# Patient Record
Sex: Female | Born: 1960 | Hispanic: Yes | Marital: Married | Smoking: Former smoker
Health system: Southern US, Community
[De-identification: ages and names within clinical notes are randomized; demographics above are authoritative.]

## PROBLEM LIST (undated history)

## (undated) DIAGNOSIS — E119 Type 2 diabetes mellitus without complications: Secondary | ICD-10-CM

## (undated) DIAGNOSIS — Z789 Other specified health status: Secondary | ICD-10-CM

## (undated) DIAGNOSIS — R918 Other nonspecific abnormal finding of lung field: Secondary | ICD-10-CM

## (undated) HISTORY — PX: TUBAL LIGATION: SHX77

## (undated) HISTORY — PX: CHOLECYSTECTOMY: SHX55

## (undated) HISTORY — DX: Type 2 diabetes mellitus without complications: E11.9

---

## 2011-09-22 ENCOUNTER — Ambulatory Visit: Payer: Self-pay | Admitting: Family Medicine

## 2012-08-18 ENCOUNTER — Encounter (HOSPITAL_COMMUNITY): Payer: Self-pay

## 2012-08-18 ENCOUNTER — Emergency Department (HOSPITAL_COMMUNITY): Payer: Medicaid Other

## 2012-08-18 ENCOUNTER — Emergency Department (HOSPITAL_COMMUNITY)
Admission: EM | Admit: 2012-08-18 | Discharge: 2012-08-19 | Disposition: A | Discharge status: Home / Self Care | Payer: Medicaid Other | Attending: Emergency Medicine | Admitting: Emergency Medicine

## 2012-08-18 DIAGNOSIS — R0602 Shortness of breath: Secondary | ICD-10-CM | POA: Insufficient documentation

## 2012-08-18 DIAGNOSIS — F411 Generalized anxiety disorder: Secondary | ICD-10-CM | POA: Insufficient documentation

## 2012-08-18 DIAGNOSIS — L539 Erythematous condition, unspecified: Secondary | ICD-10-CM | POA: Insufficient documentation

## 2012-08-18 DIAGNOSIS — R079 Chest pain, unspecified: Secondary | ICD-10-CM

## 2012-08-18 DIAGNOSIS — R0789 Other chest pain: Secondary | ICD-10-CM | POA: Insufficient documentation

## 2012-08-18 DIAGNOSIS — Z79899 Other long term (current) drug therapy: Secondary | ICD-10-CM | POA: Insufficient documentation

## 2012-08-18 DIAGNOSIS — Z87891 Personal history of nicotine dependence: Secondary | ICD-10-CM | POA: Insufficient documentation

## 2012-08-18 LAB — CBC WITH DIFFERENTIAL/PLATELET
Basophils Absolute: 0.1 10*3/uL (ref 0.0–0.1)
Basophils Relative: 1 % (ref 0–1)
Eosinophils Relative: 2 % (ref 0–5)
HCT: 39.3 % (ref 36.0–46.0)
Hemoglobin: 13.1 g/dL (ref 12.0–15.0)
MCH: 29.3 pg (ref 26.0–34.0)
MCHC: 33.3 g/dL (ref 30.0–36.0)
MCV: 87.9 fL (ref 78.0–100.0)
Monocytes Absolute: 0.6 10*3/uL (ref 0.1–1.0)
Monocytes Relative: 6 % (ref 3–12)
Neutro Abs: 5 10*3/uL (ref 1.7–7.7)
RDW: 13.9 % (ref 11.5–15.5)

## 2012-08-18 LAB — BASIC METABOLIC PANEL
BUN: 14 mg/dL (ref 6–23)
Calcium: 9.4 mg/dL (ref 8.4–10.5)
Chloride: 103 mEq/L (ref 96–112)
Creatinine, Ser: 0.56 mg/dL (ref 0.50–1.10)
GFR calc Af Amer: >90 mL/min (ref >90)

## 2012-08-18 LAB — LIPASE, BLOOD: Lipase: 51 U/L (ref 11–59)

## 2012-08-18 MED ORDER — DIAZEPAM 5 MG PO TABS
5.0000 mg | ORAL_TABLET | Freq: Once | ORAL | Status: AC
  Administered 2012-08-18: 5 mg via ORAL
  Filled 2012-08-18: qty 1

## 2012-08-18 MED ORDER — DIAZEPAM 5 MG PO TABS: 5.0000 mg | ORAL_TABLET | Freq: Two times a day (BID) | ORAL | Status: DC

## 2012-08-18 NOTE — ED Notes (Signed)
No distress.

## 2012-08-18 NOTE — ED Notes (Addendum)
Pt c/o intermittent Left "lung" burning radiating through to her back, SOB, and nausea x3 months. pt reports increase chest pain x1 weekPt also reports a burning lesion to Left side of back x4 days

## 2012-08-18 NOTE — ED Notes (Signed)
Pt to xray

## 2012-08-18 NOTE — ED Provider Notes (Signed)
History    CSN: [id number] Arrival date & time 08/18/12  1905  First MD Initiated Contact with Patient 08/18/12 2044     Chief Complaint  Patient presents with  . Chest Pain   (Consider location/radiation/quality/duration/timing/severity/associated sxs/prior Treatment) HPI Comments: 52 y.o. Female with no significant PMHx presents today with multiple complaints that have been bothering her off and on for several weeks, months, and years. Pt states she does not have a primary care provider, does not like doctors, and has not had any medical care in many years. Pt had to be re-directed frequently as many complaints over decades would come to surface. Pt seemed most concerned about the chest pain which has been intermittent over the last year and bothered her most over the last two nights. She describes the pain as burning and sharp, mild to severe, bothers her mostly at night in bed, doesn't notice it during the day, radiates to her back, and causes her to be short of breath at times. Pt states nothing makes it better or worse and she has taken no interventions. She just waits for it to go away. Denies fevers, nausea, vomiting, cough.   Patient is a 52 y.o. female presenting with chest pain.  Chest Pain Associated symptoms: shortness of breath   Associated symptoms: no diaphoresis, no dizziness, no fever, no headache, no nausea, no numbness, no palpitations, not vomiting and no weakness    History reviewed. No pertinent past medical history. Past Surgical History  Procedure Laterality Date  . Cholecystectomy    . Tubal ligation     History reviewed. No pertinent family history. History  Substance Use Topics  . Smoking status: Former Games developer  . Smokeless tobacco: Not on file  . Alcohol Use: No   OB History   Grav Para Term Preterm Abortions TAB SAB Ect Mult Living                 Review of Systems  Constitutional: Negative for fever and diaphoresis.  HENT: Negative for neck pain  and neck stiffness.   Eyes: Negative for visual disturbance.  Respiratory: Positive for shortness of breath. Negative for apnea and chest tightness.   Cardiovascular: Positive for chest pain. Negative for palpitations.  Gastrointestinal: Negative for nausea, vomiting, diarrhea and constipation.  Genitourinary: Negative for dysuria.  Musculoskeletal: Negative for gait problem.  Skin:       Complaining of a skin irritation to left side of mid back  Neurological: Negative for dizziness, weakness, light-headedness, numbness and headaches.    Allergies  Review of patient's allergies indicates no known allergies.  Home Medications   Current Outpatient Rx  Name  Route  Sig  Dispense  Refill  . Multiple Vitamin (MULTIVITAMIN WITH MINERALS) TABS   Oral   Take 1 tablet by mouth daily.          BP 114/72  Pulse 74  Temp(Src) 99.2 F (37.3 C) (Oral)  Resp 16  Ht 4\' 11"  (1.499 m)  Wt 145 lb (65.772 kg)  BMI 29.27 kg/m2  SpO2 96% Physical Exam  Nursing note and vitals reviewed. Constitutional: She is oriented to person, place, and time. She appears well-developed and well-nourished. No distress.  HENT:  Head: Normocephalic and atraumatic.  Eyes: Conjunctivae and EOM are normal.  Neck: Normal range of motion. Neck supple.  No meningeal signs  Cardiovascular: Normal rate, regular rhythm, normal heart sounds and intact distal pulses.  Exam reveals no gallop and no friction rub.  No murmur heard. Pulmonary/Chest: Effort normal and breath sounds normal. No respiratory distress. She has no wheezes. She has no rales. She exhibits no tenderness.  Abdominal: Soft. Bowel sounds are normal. She exhibits no distension. There is no tenderness. There is no rebound and no guarding.  Musculoskeletal: Normal range of motion. She exhibits no edema and no tenderness.  FROM to upper and lower extremities  Neurological: She is alert and oriented to person, place, and time. No cranial nerve deficit.   Speech is clear and goal oriented, follows commands Sensation normal to light touch and two point discrimination Moves extremities without ataxia, coordination intact Normal gait and balance Normal strength in upper and lower extremities bilaterally including dorsiflexion and plantar flexion, strong and equal grip strength   Skin: Skin is warm and dry. She is not diaphoretic. There is erythema.  1 cm oval abrasion noted on the left side of the thoracic spine distal tot he latissimus dorsi  Psychiatric:  anxious    ED Course  Procedures (including critical care time) Labs Reviewed  CBC WITH DIFFERENTIAL - Abnormal; Notable for the following:    Lymphs Abs 4.1 (*)    All other components within normal limits  BASIC METABOLIC PANEL  LIPASE, BLOOD  URINALYSIS, ROUTINE W REFLEX MICROSCOPIC  POCT I-STAT TROPONIN I   Dg Chest 2 View  08/18/2012   *RADIOLOGY REPORT*  Clinical Data: Chest pain, shortness of breath and cough off and on for 3 years, former smoker  CHEST - 2 VIEW  Comparison: None  Findings: Upper-normal size of cardiac silhouette. Slight pulmonary vascular congestion. Mediastinal contours normal. Lungs clear. No pleural effusion or pneumothorax. Bones unremarkable.  IMPRESSION: No acute abnormalities.   Original Report Authenticated By: Ulyses Southward, M.D.   1. Chest pain at rest     MDM  No hx of coronary dz in an otherwise healthy pt who admits previous episodes of anxiety for which she used to be treated medically. PE is benign, neuro exam is normal, lungs CTA, equal full expansion. Pt was highly anxious during exam, often re-directed by myself or her husband at bedside when she went on tangents of other pain and maladies that have occurred since her pregnancies, expressing worries of what they might mean. Suspicion for ACS or asthma attack is low. Not concerning for pneumothorax, pnuemonia, aortic dissection, PE (Pt denies a history of travel, immobilization, surgery, fevers,  cancer, oral contraceptives or hormone use, swelling of the legs. The patient has no history of venous thromboembolis).   "Rash" looked like an aggravation of the skin at the bra line. No evidence of SJS or necrotizing fasciitis. Not pruritic, not painful as pt was in hospital gown, some discomfort on palpation. No blisters, pustules, or scaling. No warmth, no draining sinus tracts, no superficial abscesses, no bullous impetigo, no vesicles, no desquamation, no target lesions with dusky purpura or a central bulla.  EKG shows sinus tachycardia. Tachycardia did resolve while in the ED. No evidence of ischemia or infarct. Troponin and CXR was negative. Labs unconcerning. Pt states relief after dose of Valium and requested small course to be sent home with. States she used to be on anxiety medicine and thinks she may need to return to it. Discussed importance of following up and establishing a relationship with a primary care doctor.   Discussed with pt that presentation of symptoms, tests and imaging performed today are reassuring to rule out acute coronary syndrome, pneumothorax, aortic dissection, pneumonia, or pulmonary embolism. Pt has  been advised to return to the ED is CP becomes exertional, associated with diaphoresis or nausea, radiates to left jaw/arm, worsens or becomes concerning in any way. Pt appears reliable for follow up and is agreeable to discharge.     Glade Nurse, PA-C 08/19/12 0117

## 2012-08-18 NOTE — ED Notes (Signed)
The pt returned from xray.  Med given.  Pt comfortable

## 2012-08-18 NOTE — ED Notes (Signed)
The pt has a red rash  To  Her posterior chest for a few days and she has pain there and she just does not feel well.  Alert no distress.  Looking through a book

## 2012-08-18 NOTE — ED Notes (Signed)
The pt is still waiting to be seen.  Up to the br

## 2012-08-26 NOTE — ED Provider Notes (Signed)
Medical screening examination/treatment/procedure(s) were performed by non-physician practitioner and as supervising physician I was immediately available for consultation/collaboration.   Torina Ey, MD 08/26/12 1258 

## 2013-12-27 ENCOUNTER — Emergency Department (HOSPITAL_COMMUNITY)
Admission: EM | Admit: 2013-12-27 | Discharge: 2013-12-27 | Disposition: A | Discharge status: Home / Self Care | Payer: Medicaid Other | Attending: Emergency Medicine | Admitting: Emergency Medicine

## 2013-12-27 ENCOUNTER — Encounter (HOSPITAL_COMMUNITY): Payer: Self-pay | Admitting: Family Medicine

## 2013-12-27 ENCOUNTER — Emergency Department (HOSPITAL_COMMUNITY): Payer: Medicaid Other

## 2013-12-27 DIAGNOSIS — Z79899 Other long term (current) drug therapy: Secondary | ICD-10-CM | POA: Diagnosis not present

## 2013-12-27 DIAGNOSIS — Z87891 Personal history of nicotine dependence: Secondary | ICD-10-CM | POA: Diagnosis not present

## 2013-12-27 DIAGNOSIS — R091 Pleurisy: Secondary | ICD-10-CM | POA: Insufficient documentation

## 2013-12-27 DIAGNOSIS — R05 Cough: Secondary | ICD-10-CM | POA: Diagnosis present

## 2013-12-27 DIAGNOSIS — R059 Cough, unspecified: Secondary | ICD-10-CM

## 2013-12-27 DIAGNOSIS — J189 Pneumonia, unspecified organism: Secondary | ICD-10-CM

## 2013-12-27 DIAGNOSIS — R630 Anorexia: Secondary | ICD-10-CM | POA: Insufficient documentation

## 2013-12-27 MED ORDER — HYDROCOD POLST-CHLORPHEN POLST 10-8 MG/5ML PO LQCR: 5.0000 mL | Freq: Two times a day (BID) | ORAL | Status: DC | PRN

## 2013-12-27 MED ORDER — HYDROCODONE-ACETAMINOPHEN 5-325 MG PO TABS: 1.0000 | ORAL_TABLET | ORAL | Status: DC | PRN

## 2013-12-27 MED ORDER — AZITHROMYCIN 250 MG PO TABS
500.0000 mg | ORAL_TABLET | Freq: Once | ORAL | Status: AC
  Administered 2013-12-27: 500 mg via ORAL
  Filled 2013-12-27: qty 2

## 2013-12-27 MED ORDER — ALBUTEROL SULFATE (2.5 MG/3ML) 0.083% IN NEBU
5.0000 mg | INHALATION_SOLUTION | Freq: Once | RESPIRATORY_TRACT | Status: AC
  Administered 2013-12-27: 5 mg via RESPIRATORY_TRACT
  Filled 2013-12-27: qty 6

## 2013-12-27 MED ORDER — AZITHROMYCIN 250 MG PO TABS: 250.0000 mg | ORAL_TABLET | Freq: Every day | ORAL | Status: DC

## 2013-12-27 NOTE — ED Notes (Addendum)
Pt presents from home with c/o dry non-productive cough with sore throat x3 days. PT reports chest, abdominal and back pain only when coughing continuously - pt reports taking Aleve approx 5 hours ago and is pain free at this time.

## 2013-12-27 NOTE — Discharge Instructions (Signed)
Take Vicodin for severe pain only. No driving or operating heavy machinery while taking vicodin. This medication may cause drowsiness. Take Tussionex for severe cough only. No driving or operating heavy machinery while taking tussionex. This medication may cause drowsiness. Take azithromycin once daily beginning tomorrow as you were given the first dose in the emergency department today.  Cough, Adult  A cough is a reflex that helps clear your throat and airways. It can help heal the body or may be a reaction to an irritated airway. A cough may only last 2 or 3 weeks (acute) or may last more than 8 weeks (chronic).  CAUSES Acute cough:  Viral or bacterial infections. Chronic cough:  Infections.  Allergies.  Asthma.  Post-nasal drip.  Smoking.  Heartburn or acid reflux.  Some medicines.  Chronic lung problems (COPD).  Cancer. SYMPTOMS   Cough.  Fever.  Chest pain.  Increased breathing rate.  High-pitched whistling sound when breathing (wheezing).  Colored mucus that you cough up (sputum). TREATMENT   A bacterial cough may be treated with antibiotic medicine.  A viral cough must run its course and will not respond to antibiotics.  Your caregiver may recommend other treatments if you have a chronic cough. HOME CARE INSTRUCTIONS   Only take over-the-counter or prescription medicines for pain, discomfort, or fever as directed by your caregiver. Use cough suppressants only as directed by your caregiver.  Use a cold steam vaporizer or humidifier in your bedroom or home to help loosen secretions.  Sleep in a semi-upright position if your cough is worse at night.  Rest as needed.  Stop smoking if you smoke. SEEK IMMEDIATE MEDICAL CARE IF:   You have pus in your sputum.  Your cough starts to worsen.  You cannot control your cough with suppressants and are losing sleep.  You begin coughing up blood.  You have difficulty breathing.  You develop pain which  is getting worse or is uncontrolled with medicine.  You have a fever. MAKE SURE YOU:   Understand these instructions.  Will watch your condition.  Will get help right away if you are not doing well or get worse. Document Released: 07/30/2010 Document Revised: 04/25/2011 Document Reviewed: 07/30/2010 [employer] [facility] Patient Information 2015 [place], [place]. This information is not intended to replace advice given to you by your health care provider. Make sure you discuss any questions you have with your health care provider.  Pneumonia Pneumonia is an infection of the lungs.  CAUSES Pneumonia may be caused by bacteria or a virus. Usually, these infections are caused by breathing infectious particles into the lungs (respiratory tract). SIGNS AND SYMPTOMS   Cough.  Fever.  Chest pain.  Increased rate of breathing.  Wheezing.  Mucus production. DIAGNOSIS  If you have the common symptoms of pneumonia, your health care provider will typically confirm the diagnosis with a chest X-ray. The X-ray will show an abnormality in the lung (pulmonary infiltrate) if you have pneumonia. Other tests of your blood, urine, or sputum may be done to find the specific cause of your pneumonia. Your health care provider may also do tests (blood gases or pulse oximetry) to see how well your lungs are working. TREATMENT  Some forms of pneumonia may be spread to other people when you cough or sneeze. You may be asked to wear a mask before and during your exam. Pneumonia that is caused by bacteria is treated with antibiotic medicine. Pneumonia that is caused by the influenza virus may be treated with  an antiviral medicine. Most other viral infections must run their course. These infections will not respond to antibiotics.  HOME CARE INSTRUCTIONS   Cough suppressants may be used if you are losing too much rest. However, coughing protects you by clearing your lungs. You should avoid using cough suppressants if you  can.  Your health care provider may have prescribed medicine if he or she thinks your pneumonia is caused by bacteria or influenza. Finish your medicine even if you start to feel better.  Your health care provider may also prescribe an expectorant. This loosens the mucus to be coughed up.  Take medicines only as directed by your health care provider.  Do not smoke. Smoking is a common cause of bronchitis and can contribute to pneumonia. If you are a smoker and continue to smoke, your cough may last several weeks after your pneumonia has cleared.  A cold steam vaporizer or humidifier in your room or home may help loosen mucus.  Coughing is often worse at night. Sleeping in a semi-upright position in a recliner or using a couple pillows under your head will help with this.  Get rest as you feel it is needed. Your body will usually let you know when you need to rest. PREVENTION A pneumococcal shot (vaccine) is available to prevent a common bacterial cause of pneumonia. This is usually suggested for:  People over 51 years old.  Patients on chemotherapy.  People with chronic lung problems, such as bronchitis or emphysema.  People with immune system problems. If you are over 65 or have a high risk condition, you may receive the pneumococcal vaccine if you have not received it before. In some countries, a routine influenza vaccine is also recommended. This vaccine can help prevent some cases of pneumonia.You may be offered the influenza vaccine as part of your care. If you smoke, it is time to quit. You may receive instructions on how to stop smoking. Your health care provider can provide medicines and counseling to help you quit. SEEK MEDICAL CARE IF: You have a fever. SEEK IMMEDIATE MEDICAL CARE IF:   Your illness becomes worse. This is especially true if you are elderly or weakened from any other disease.  You cannot control your cough with suppressants and are losing sleep.  You  begin coughing up blood.  You develop pain which is getting worse or is uncontrolled with medicines.  Any of the symptoms which initially brought you in for treatment are getting worse rather than better.  You develop shortness of breath or chest pain. MAKE SURE YOU:   Understand these instructions.  Will watch your condition.  Will get help right away if you are not doing well or get worse. Document Released: 01/31/2005 Document Revised: 06/17/2013 Document Reviewed: 04/22/2010 [facility] Patient Information 2015 [place], [place]. This information is not intended to replace advice given to you by your health care provider. Make sure you discuss any questions you have with your health care provider.

## 2013-12-27 NOTE — ED Provider Notes (Signed)
CSN: [id number]     Arrival date & time 12/27/13  1709 History   First MD Initiated Contact with Patient 12/27/13 2033     Chief Complaint  Patient presents with  . Cough  . Pleurisy     (Consider location/radiation/quality/duration/timing/severity/associated sxs/prior Treatment) HPI Comments: Patient is a 53 y/o female who presents to the Emergency Department with a dry cough andchest pain for the past 3 days. Chest pain present with coughing, mostly on the left radiating towards the right. She states she has had numerous episodes of nonbloody post tussive emesis and decreased po intake. It is increased with coughing and relieved with Aleve. Has tried Mucinex and Theraflu with minimal relief. Reports shortness of breath, headaches, generalized weakness, and second hand smoke exposure. She denies abdominal pain or recent travel. No fevers.  Patient is a 53 y.o. female presenting with cough. The history is provided by the patient.  Cough Associated symptoms: shortness of breath     History reviewed. No pertinent past medical history. Past Surgical History  Procedure Laterality Date  . Cholecystectomy    . Tubal ligation     No family history on file. History  Substance Use Topics  . Smoking status: Former Games developermoker  . Smokeless tobacco: Not on file  . Alcohol Use: No   OB History    No data available     Review of Systems  Constitutional: Positive for appetite change.  Respiratory: Positive for cough, chest tightness and shortness of breath.   Gastrointestinal: Positive for vomiting and diarrhea.  All other systems reviewed and are negative.     Allergies  Review of patient's allergies indicates no known allergies.  Home Medications   Prior to Admission medications   Medication Sig Start Date End Date Taking? Authorizing Provider  Multiple Vitamin (MULTIVITAMIN WITH MINERALS) TABS Take 1 tablet by mouth daily.   Yes Historical Provider, MD  azithromycin (ZITHROMAX)  250 MG tablet Take 1 tablet (250 mg total) by mouth daily. Take 1 tab PO x 4 days 12/27/13   Kathrynn Speedobyn M Leston Schueller, PA-C  chlorpheniramine-HYDROcodone (TUSSIONEX PENNKINETIC ER) 10-8 MG/5ML LQCR Take 5 mLs by mouth every 12 (twelve) hours as needed for cough. 12/27/13   Kathrynn Speedobyn M Lynzy Rawles, PA-C  diazepam (VALIUM) 5 MG tablet Take 1 tablet (5 mg total) by mouth 2 (two) times daily. Patient not taking: Reported on 12/27/2013 08/18/12   Glade NurseBarbara Beck, PA-C  HYDROcodone-acetaminophen (NORCO/VICODIN) 5-325 MG per tablet Take 1-2 tablets by mouth every 4 (four) hours as needed. 12/27/13   Melany Wiesman M Eliezer Khawaja, PA-C   BP 143/78 mmHg  Pulse 100  Temp(Src) 99 F (37.2 C) (Oral)  Resp 11  SpO2 97% Physical Exam  Constitutional: She is oriented to person, place, and time. She appears well-developed and well-nourished. No distress.  HENT:  Head: Normocephalic and atraumatic.  Post oropharyngeal erythema without edema or exudate.  Eyes: Conjunctivae and EOM are normal.  Neck: Normal range of motion. Neck supple.  Cardiovascular: Regular rhythm and normal heart sounds.   Tachycardic.  Pulmonary/Chest: Effort normal. No respiratory distress.  Minimal end-expiratory wheezes bilateral.  Musculoskeletal: Normal range of motion. She exhibits no edema.  Lymphadenopathy:    She has no cervical adenopathy.  Neurological: She is alert and oriented to person, place, and time. No sensory deficit.  Skin: Skin is warm and dry.  Psychiatric: She has a normal mood and affect. Her behavior is normal.  Nursing note and vitals reviewed.   ED Course  Procedures (including  critical care time) Labs Review Labs Reviewed - No data to display  Imaging Review Dg Chest 2 View  12/27/2013   CLINICAL DATA:  Cough for 3 days with fever  EXAM: CHEST  2 VIEW  COMPARISON:  None.  FINDINGS: Cardiac shadow is within normal limits. The lungs are well aerated bilaterally. Minimal increased density is noted in the right lung base consistent with early  infiltrate. No sizable effusion is noted. No bony abnormality is seen.  IMPRESSION: Early right basilar infiltrate.   Electronically Signed   By: Alcide CleverMark  Lukens M.D.   On: 12/27/2013 18:40     EKG Interpretation None      MDM   Final diagnoses:  Cough  CAP (community acquired pneumonia)   Pt with cough, sore throat, chest pain with coughing, in NAD, Afebrile, tachycardic on arrival. Mild end-expiratory wheezes bilateral on exam. CXR obtained prior to pt being seen, early right basilar infiltrate noted. Given pt's symptoms of cough and pleuritic chest pain, will treat for pneumonia. Doubt PE. She is stable for d/c home. No hypoxia. Will d/c with abx, albuterol inhaler and cough medication. Resources given for PCP f/u. Return precautions given. Patient states understanding of treatment care plan and is agreeable.   Kathrynn Speedobyn M Kelan Pritt, PA-C 12/28/13 [id number]  Audree CamelScott T Goldston, MD 12/28/13 [phone]

## 2014-04-09 ENCOUNTER — Inpatient Hospital Stay (HOSPITAL_COMMUNITY)
Admission: EM | Admit: 2014-04-09 | Discharge: 2014-04-12 | DRG: 871 | Disposition: A | Discharge status: Home / Self Care | Payer: Medicaid Other | Attending: Internal Medicine | Admitting: Internal Medicine

## 2014-04-09 ENCOUNTER — Encounter (HOSPITAL_COMMUNITY): Payer: Self-pay | Admitting: Emergency Medicine

## 2014-04-09 ENCOUNTER — Emergency Department (HOSPITAL_COMMUNITY): Payer: Medicaid Other

## 2014-04-09 DIAGNOSIS — K219 Gastro-esophageal reflux disease without esophagitis: Secondary | ICD-10-CM | POA: Diagnosis present

## 2014-04-09 DIAGNOSIS — N8320 Unspecified ovarian cysts: Secondary | ICD-10-CM | POA: Diagnosis present

## 2014-04-09 DIAGNOSIS — J09X2 Influenza due to identified novel influenza A virus with other respiratory manifestations: Secondary | ICD-10-CM | POA: Diagnosis present

## 2014-04-09 DIAGNOSIS — R109 Unspecified abdominal pain: Secondary | ICD-10-CM

## 2014-04-09 DIAGNOSIS — J9601 Acute respiratory failure with hypoxia: Secondary | ICD-10-CM | POA: Diagnosis present

## 2014-04-09 DIAGNOSIS — Z87891 Personal history of nicotine dependence: Secondary | ICD-10-CM | POA: Diagnosis not present

## 2014-04-09 DIAGNOSIS — J189 Pneumonia, unspecified organism: Secondary | ICD-10-CM | POA: Diagnosis present

## 2014-04-09 DIAGNOSIS — A419 Sepsis, unspecified organism: Principal | ICD-10-CM | POA: Diagnosis present

## 2014-04-09 DIAGNOSIS — J101 Influenza due to other identified influenza virus with other respiratory manifestations: Secondary | ICD-10-CM | POA: Insufficient documentation

## 2014-04-09 DIAGNOSIS — N83201 Unspecified ovarian cyst, right side: Secondary | ICD-10-CM

## 2014-04-09 DIAGNOSIS — R531 Weakness: Secondary | ICD-10-CM | POA: Diagnosis not present

## 2014-04-09 DIAGNOSIS — B349 Viral infection, unspecified: Secondary | ICD-10-CM | POA: Insufficient documentation

## 2014-04-09 DIAGNOSIS — R6510 Systemic inflammatory response syndrome (SIRS) of non-infectious origin without acute organ dysfunction: Secondary | ICD-10-CM | POA: Diagnosis present

## 2014-04-09 DIAGNOSIS — Z9049 Acquired absence of other specified parts of digestive tract: Secondary | ICD-10-CM | POA: Diagnosis present

## 2014-04-09 DIAGNOSIS — R509 Fever, unspecified: Secondary | ICD-10-CM | POA: Insufficient documentation

## 2014-04-09 DIAGNOSIS — R0902 Hypoxemia: Secondary | ICD-10-CM | POA: Insufficient documentation

## 2014-04-09 DIAGNOSIS — R1084 Generalized abdominal pain: Secondary | ICD-10-CM

## 2014-04-09 DIAGNOSIS — R945 Abnormal results of liver function studies: Secondary | ICD-10-CM

## 2014-04-09 DIAGNOSIS — R7989 Other specified abnormal findings of blood chemistry: Secondary | ICD-10-CM

## 2014-04-09 DIAGNOSIS — R0602 Shortness of breath: Secondary | ICD-10-CM

## 2014-04-09 HISTORY — DX: Other specified health status: Z78.9

## 2014-04-09 LAB — URINALYSIS, ROUTINE W REFLEX MICROSCOPIC
Bilirubin Urine: NEGATIVE
Glucose, UA: NEGATIVE mg/dL
Hgb urine dipstick: NEGATIVE
Ketones, ur: NEGATIVE mg/dL
Leukocytes, UA: NEGATIVE
Nitrite: NEGATIVE
PROTEIN: 30 mg/dL — AB
Specific Gravity, Urine: 1.028 (ref 1.005–1.030)
UROBILINOGEN UA: 0.2 mg/dL (ref 0.0–1.0)
pH: 6 (ref 5.0–8.0)

## 2014-04-09 LAB — COMPREHENSIVE METABOLIC PANEL
ALBUMIN: 4.4 g/dL (ref 3.5–5.2)
ALT: 84 U/L — AB (ref 0–35)
AST: 57 U/L — ABNORMAL HIGH (ref 0–37)
Alkaline Phosphatase: 133 U/L — ABNORMAL HIGH (ref 39–117)
Anion gap: 9 (ref 5–15)
BILIRUBIN TOTAL: 0.4 mg/dL (ref 0.3–1.2)
BUN: 8 mg/dL (ref 6–23)
CALCIUM: 9.2 mg/dL (ref 8.4–10.5)
CO2: 29 mmol/L (ref 19–32)
Chloride: 99 mmol/L (ref 96–112)
Creatinine, Ser: 0.73 mg/dL (ref 0.50–1.10)
Glucose, Bld: 145 mg/dL — ABNORMAL HIGH (ref 70–99)
Potassium: 3.5 mmol/L (ref 3.5–5.1)
Sodium: 137 mmol/L (ref 135–145)
TOTAL PROTEIN: 8.6 g/dL — AB (ref 6.0–8.3)

## 2014-04-09 LAB — URINE MICROSCOPIC-ADD ON

## 2014-04-09 LAB — CBC WITH DIFFERENTIAL/PLATELET
BASOS ABS: 0 10*3/uL (ref 0.0–0.1)
Basophils Relative: 1 % (ref 0–1)
EOS ABS: 0 10*3/uL (ref 0.0–0.7)
EOS PCT: 0 % (ref 0–5)
HCT: 46.2 % — ABNORMAL HIGH (ref 36.0–46.0)
Hemoglobin: 15.3 g/dL — ABNORMAL HIGH (ref 12.0–15.0)
LYMPHS PCT: 30 % (ref 12–46)
Lymphs Abs: 1.8 10*3/uL (ref 0.7–4.0)
MCH: 29.9 pg (ref 26.0–34.0)
MCHC: 33.1 g/dL (ref 30.0–36.0)
MCV: 90.4 fL (ref 78.0–100.0)
Monocytes Absolute: 0.4 10*3/uL (ref 0.1–1.0)
Monocytes Relative: 6 % (ref 3–12)
Neutro Abs: 3.8 10*3/uL (ref 1.7–7.7)
Neutrophils Relative %: 63 % (ref 43–77)
Platelets: 244 10*3/uL (ref 150–400)
RBC: 5.11 MIL/uL (ref 3.87–5.11)
RDW: 14.1 % (ref 11.5–15.5)
WBC: 6 10*3/uL (ref 4.0–10.5)

## 2014-04-09 LAB — I-STAT CG4 LACTIC ACID, ED: Lactic Acid, Venous: 2.2 mmol/L (ref 0.5–2.0)

## 2014-04-09 LAB — LIPASE, BLOOD: Lipase: 42 U/L (ref 11–59)

## 2014-04-09 MED ORDER — ONDANSETRON HCL 4 MG PO TABS: 4.0000 mg | ORAL_TABLET | Freq: Four times a day (QID) | ORAL | Status: DC | PRN

## 2014-04-09 MED ORDER — IOHEXOL 350 MG/ML SOLN
100.0000 mL | Freq: Once | INTRAVENOUS | Status: AC | PRN
  Administered 2014-04-09: 100 mL via INTRAVENOUS

## 2014-04-09 MED ORDER — INFLUENZA VAC SPLIT QUAD 0.5 ML IM SUSY: 0.5000 mL | PREFILLED_SYRINGE | INTRAMUSCULAR | Status: DC | PRN

## 2014-04-09 MED ORDER — ONDANSETRON HCL 4 MG/2ML IJ SOLN
4.0000 mg | Freq: Once | INTRAMUSCULAR | Status: AC
  Administered 2014-04-09: 4 mg via INTRAVENOUS
  Filled 2014-04-09: qty 2

## 2014-04-09 MED ORDER — ONDANSETRON HCL 4 MG PO TABS: 4.0000 mg | ORAL_TABLET | Freq: Four times a day (QID) | ORAL | Status: DC

## 2014-04-09 MED ORDER — OSELTAMIVIR PHOSPHATE 75 MG PO CAPS
75.0000 mg | ORAL_CAPSULE | Freq: Once | ORAL | Status: AC
  Administered 2014-04-09: 75 mg via ORAL
  Filled 2014-04-09: qty 1

## 2014-04-09 MED ORDER — ACETAMINOPHEN 325 MG PO TABS
ORAL_TABLET | ORAL | Status: AC
  Filled 2014-04-09: qty 2

## 2014-04-09 MED ORDER — ACETAMINOPHEN 325 MG PO TABS
650.0000 mg | ORAL_TABLET | Freq: Four times a day (QID) | ORAL | Status: DC | PRN
Start: 2014-04-09 — End: 2014-04-09
  Administered 2014-04-09: 650 mg via ORAL
  Filled 2014-04-09: qty 2

## 2014-04-09 MED ORDER — OSELTAMIVIR PHOSPHATE 75 MG PO CAPS
75.0000 mg | ORAL_CAPSULE | Freq: Two times a day (BID) | ORAL | Status: DC
  Administered 2014-04-09 – 2014-04-12 (×6): 75 mg via ORAL
  Filled 2014-04-09 (×7): qty 1

## 2014-04-09 MED ORDER — ACETAMINOPHEN 325 MG PO TABS
650.0000 mg | ORAL_TABLET | Freq: Once | ORAL | Status: AC
  Administered 2014-04-09: 650 mg via ORAL
  Filled 2014-04-09: qty 2

## 2014-04-09 MED ORDER — OSELTAMIVIR PHOSPHATE 75 MG PO CAPS: 75.0000 mg | ORAL_CAPSULE | Freq: Two times a day (BID) | ORAL | Status: DC

## 2014-04-09 MED ORDER — LEVALBUTEROL HCL 0.63 MG/3ML IN NEBU: 0.6300 mg | INHALATION_SOLUTION | Freq: Four times a day (QID) | RESPIRATORY_TRACT | Status: DC | PRN

## 2014-04-09 MED ORDER — IPRATROPIUM-ALBUTEROL 0.5-2.5 (3) MG/3ML IN SOLN
3.0000 mL | Freq: Once | RESPIRATORY_TRACT | Status: AC
  Administered 2014-04-09: 3 mL via RESPIRATORY_TRACT
  Filled 2014-04-09: qty 3

## 2014-04-09 MED ORDER — SODIUM CHLORIDE 0.9 % IV SOLN
INTRAVENOUS | Status: AC
  Administered 2014-04-09 – 2014-04-10 (×3): via INTRAVENOUS

## 2014-04-09 MED ORDER — ALBUTEROL SULFATE HFA 108 (90 BASE) MCG/ACT IN AERS: 2.0000 | INHALATION_SPRAY | RESPIRATORY_TRACT | Status: DC | PRN

## 2014-04-09 MED ORDER — HYDROCODONE-ACETAMINOPHEN 5-325 MG PO TABS
1.0000 | ORAL_TABLET | ORAL | Status: DC | PRN
Start: 2014-04-09 — End: 2014-04-12

## 2014-04-09 MED ORDER — ALBUTEROL SULFATE (2.5 MG/3ML) 0.083% IN NEBU
5.0000 mg | INHALATION_SOLUTION | Freq: Once | RESPIRATORY_TRACT | Status: AC
  Administered 2014-04-09: 5 mg via RESPIRATORY_TRACT
  Filled 2014-04-09: qty 6

## 2014-04-09 MED ORDER — HYDROCODONE-ACETAMINOPHEN 5-325 MG PO TABS: 1.0000 | ORAL_TABLET | ORAL | Status: DC | PRN

## 2014-04-09 MED ORDER — SODIUM CHLORIDE 0.9 % IV BOLUS (SEPSIS)
1000.0000 mL | Freq: Once | INTRAVENOUS | Status: AC
  Administered 2014-04-09: 1000 mL via INTRAVENOUS

## 2014-04-09 MED ORDER — MORPHINE SULFATE 4 MG/ML IJ SOLN
4.0000 mg | Freq: Once | INTRAMUSCULAR | Status: AC
  Administered 2014-04-09: 4 mg via INTRAVENOUS
  Filled 2014-04-09: qty 1

## 2014-04-09 MED ORDER — ACETAMINOPHEN 325 MG PO TABS
650.0000 mg | ORAL_TABLET | Freq: Once | ORAL | Status: AC
  Administered 2014-04-09: 650 mg via ORAL

## 2014-04-09 MED ORDER — IPRATROPIUM BROMIDE 0.02 % IN SOLN
0.5000 mg | Freq: Once | RESPIRATORY_TRACT | Status: AC
  Administered 2014-04-09: 0.5 mg via RESPIRATORY_TRACT
  Filled 2014-04-09: qty 2.5

## 2014-04-09 MED ORDER — FENTANYL CITRATE 0.05 MG/ML IJ SOLN
50.0000 ug | Freq: Once | INTRAMUSCULAR | Status: AC
  Administered 2014-04-09: 50 ug via INTRAVENOUS
  Filled 2014-04-09: qty 2

## 2014-04-09 MED ORDER — ACETAMINOPHEN 650 MG RE SUPP: 650.0000 mg | Freq: Four times a day (QID) | RECTAL | Status: DC | PRN

## 2014-04-09 MED ORDER — ONDANSETRON HCL 4 MG/2ML IJ SOLN: 4.0000 mg | Freq: Four times a day (QID) | INTRAMUSCULAR | Status: DC | PRN

## 2014-04-09 MED ORDER — IOHEXOL 300 MG/ML  SOLN
25.0000 mL | INTRAMUSCULAR | Status: AC
  Administered 2014-04-09: 25 mL via ORAL

## 2014-04-09 MED ORDER — ACETAMINOPHEN 325 MG PO TABS
650.0000 mg | ORAL_TABLET | Freq: Four times a day (QID) | ORAL | Status: DC | PRN
  Administered 2014-04-10 – 2014-04-11 (×4): 650 mg via ORAL
  Filled 2014-04-09 (×4): qty 2

## 2014-04-09 NOTE — ED Provider Notes (Signed)
3:30 PM: At end of shift, received hand-off report from Marlon Peliffany Greene, PA-C. Plan includes CT angio chest and abd/pelvis and re-eval. If scans negative and symptoms improved may dispo home with tamiflu, zofran and nausea meds.  Pt currently resting without distress.   5:30 PM: Pt continues to rest without distress, awaiting CT scan.   7:30 PM: CT scans reviewed, hepatomegaly and steatosis and ovarian cyst noted on abd CT. Findings consistent with bronchitis noted on chest CT. Discussed case with Dr. Karma GanjaLinker.  Plan to discharge pt with meds and follow-up, discussed findings with pt.  However, pt's temperature has returned and pt remains tachycardic.  O2 sats when off nasal cannula decreases to 90%.  Pt given tylenol and albuterol neb tx.     8:00 PM: Pt repositioned in bed and incentive spirometry demonstrated and pt returned demonstration, with no improvement on O2 sats.  Pt placed back on nasal cannula.  8:15 PM: Consulted Dr. Toniann FailKakrakandy (hospitalist) for admission due to hypoxia on room air. Pt admitted to telemetry bed.  He requested blood and urine cultures, labs ordered as requested.  The patient appears reasonably stabilized for admission considering the current resources, flow, and capabilities available in the ED at this time, and I doubt any further screening and/or treatment in the ED prior to admission.   Harle BattiestElizabeth Teruo Stilley, NP 04/10/14 [id number]  Andrea ChickMartha K Linker, MD 04/10/14 [phone]

## 2014-04-09 NOTE — ED Notes (Signed)
Called CT about when pt would be transported for CT - pt next in line.

## 2014-04-09 NOTE — Consult Note (Signed)
Name: Andrea Villegas MRN: [id number] DOB: Jun 30, 1960    ADMISSION DATE:  04/09/2014 CONSULTATION DATE:  04/09/2014  REFERRING MD :  Hal Hope  CHIEF COMPLAINT:  Fever, weakness, malaise  BRIEF PATIENT DESCRIPTION: 54 y.o. F brought to [facility] ED 2/24 with fever, weakness, myalgias, generalized weakness x 5 - 6 days.  CTA of chest revealed GGO's and scattered nodules; therefore, PCCM consulted for recs.  SIGNIFICANT EVENTS  2/24 - admit  STUDIES:  CTA chest 2/24 >>> GGO's, multiple peripheral nodules < 43m throughout bilateral lungs. CT abd pelv 2/24 >>> 5.3 cm cyst R adnexa, hepatomegaly with steatosis, diverticular disease.   HISTORY OF PRESENT ILLNESS:  Andrea Villegas a 54y.o. F with no significant PMH [facility] subjective fevers, weakness, malaise, generalized weakness x 5 - 6 days.  She states symptom onset was gradual and has persisted and progressed since.  She has had associated dry cough, but does report she frequently has a dry cough.  She was not having any chest pain until today but attributes this to frequent coughing and states pain only felt while coughing.  She denies chills/sweats, N/V/D, abdominal pain, LE edema / cramping, swelling.  She does state that during night prior to admit she was wheezing while trying to sleep and felt SOB.  She has not taken any medications at home for any of her symptoms.  No recent travel. In ED, she was found to have fever to 103 F, HR of 116 and SpO2 95% on RA.  CTA chest revealed GGO's and multiple small (<565m peripheral nodules.  PCCM was consulted for further recs.   PULMONARY HISTORY Cigarette Use: Remote smoking history, less than 1ppd for roughly 1 year only.  Is exposed to second hand smoke almost daily. Other Inhaled Substances: Denies. 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  Has chronic exposure to dust.  No known exposures to coal, asbestos, silica,  Military: Denies. Pets: 2 dogs in the home.  Has never had problems with them before. Aspiration: Denies. Meds: Denies.  She has no hx of any malignancy.  Has not had colonoscopy or mammogram.   PAST MEDICAL HISTORY :   has a past medical history of Medical history non-contributory.  has past surgical history that includes Cholecystectomy and Tubal ligation. Prior to Admission medications   Medication Sig Start Date End Date Taking? Authorizing Provider  Multiple Vitamin (MULTIVITAMIN WITH MINERALS) TABS Take 1 tablet by mouth daily.   Yes Historical Provider, MD  albuterol (PROVENTIL HFA;VENTOLIN HFA) 108 (90 BASE) MCG/ACT inhaler Inhale 2 puffs into the lungs every 4 (four) hours as needed for wheezing or shortness of breath. 04/09/14   ElBritt BottomNP  HYDROcodone-acetaminophen (NORCO/VICODIN) 5-325 MG per tablet Take 1 tablet by mouth every 4 (four) hours as needed. 04/09/14   ElBritt BottomNP  ondansetron (ZOFRAN) 4 MG tablet Take 1 tablet (4 mg total) by mouth every 6 (six) hours. 04/09/14   ElBritt BottomNP  oseltamivir (TAMIFLU) 75 MG capsule Take 1 capsule (75 mg total) by mouth every 12 (twelve) hours. 04/09/14   ElBritt BottomNP   No Known Allergies  FAMILY HISTORY:  family history includes CAD in her father; Diabetes Mellitus II in her father. SOCIAL HISTORY:  reports that she has never smoked. She does not have any smokeless tobacco history  on file. She reports that she does not drink alcohol or use illicit drugs.  REVIEW OF SYSTEMS:   All negative; except for those that are bolded, which indicate positives.  Constitutional: weight loss, weight gain, night sweats, fevers, chills, fatigue, weakness.  HEENT: headaches, sore throat, sneezing, nasal congestion, post nasal drip, difficulty swallowing, tooth/dental problems, visual complaints, visual changes, ear  aches. Neuro: difficulty with speech, weakness, numbness, ataxia. CV:  chest pain, orthopnea, PND, swelling in lower extremities, dizziness, palpitations, syncope.  Resp: cough, hemoptysis, dyspnea, wheezing. GI  heartburn, indigestion, abdominal pain, nausea, vomiting, diarrhea, constipation, change in bowel habits, loss of appetite, hematemesis, melena, hematochezia.  GU: dysuria, change in color of urine, urgency or frequency, flank pain, hematuria. MSK: joint pain or swelling, decreased range of motion, myalgias. Psych: change in mood or affect, depression, anxiety, suicidal ideations, homicidal ideations. Skin: rash, itching, bruising.   SUBJECTIVE:  Feels the same with generalized weakness.  Breathing improved with supplemental O2.  VITAL SIGNS: Temp:  [99.6 F (37.6 C)-103 F (39.4 C)] 103 F (39.4 C) (02/24 2207) Pulse Rate:  [89-116] 108 (02/24 2207) Resp:  [11-23] 19 (02/24 2207) BP: (101-129)/(60-76) 120/66 mmHg (02/24 2207) SpO2:  [78 %-100 %] 91 % (02/24 2207)  PHYSICAL EXAMINATION: General: WDWN female, resting in bed, in NAD. Neuro: A&O x 3, non-focal.  HEENT: [place]/AT. PERRL, sclerae anicteric. Cardiovascular: RRR, no M/R/G.  Lungs: Respirations even and unlabored.  Faint scattered rhonchi. Abdomen: BS x 4, soft, NT/ND.  Musculoskeletal: No gross deformities, no edema.  Skin: Intact, warm, no rashes.    Recent Labs Lab 04/09/14 1108  NA 137  K 3.5  CL 99  CO2 29  BUN 8  CREATININE 0.73  GLUCOSE 145*    Recent Labs Lab 04/09/14 1108  HGB 15.3*  HCT 46.2*  WBC 6.0  PLT 244   Dg Chest 2 View  04/09/2014   CLINICAL DATA:  Dry cough for 4 days with shortness of Breath  EXAM: CHEST  2 VIEW  COMPARISON:  12/27/2013  FINDINGS: Cardiac shadow is within normal limits. The lungs are well aerated bilaterally. No focal infiltrate or sizable effusion is seen. No acute bony abnormality is noted.  IMPRESSION: No acute abnormality seen.   Electronically Signed    By: Inez Catalina M.D.   On: 04/09/2014 12:45   Ct Angio Chest Pe W/cm &/or Wo Cm  04/09/2014   CLINICAL DATA:  54 year old female with a history of generalized weakness, dizziness, fever. Elevated LFTs.  EXAM: CT CHEST, ABDOMEN, AND PELVIS WITH CONTRAST  TECHNIQUE: Multidetector CT imaging of the chest, abdomen and pelvis was performed following the standard protocol during bolus administration of intravenous contrast.  CONTRAST:  124m OMNIPAQUE IOHEXOL 350 MG/ML SOLN  COMPARISON:  None.  FINDINGS: CT CHEST FINDINGS  Unremarkable appearance of the superficial soft tissues of the chest.  Unremarkable appearance of the thoracic inlet and thyroid gland.  Axillary lymph nodes are present, though are not enlarged by CT size criteria. No supraclavicular adenopathy.  Central airways are patent with no debris. There are a few more peripheral airways with debris within the lumen (such as left lower lobe image 87 of series 402).  The arm lymph nodes within the mediastinum, not enlarged by CT size criteria.  Heart size within normal limits with no pericardial fluid/ thickening. No calcifications of the coronary vasculature.  There is circumferential thickening of the distal esophagus. Several lymph nodes are present in the periesophageal stations at the esophageal hiatus.  Largest measures approximately 6 mm to 7 mm on image 80 of series 401.  Wedge-shaped ground-glass/reticular opacity at the periphery of the right upper lobe, abutting the minor fissure. Geographic ground-glass throughout the lungs, with some regions of hyper aerated lung.  Several ill-defined nodules are present throughout the lungs, including the largest in the left upper lobe on image 50 of series 402 measuring 5 mm. Majority of the nodules are at the periphery of the lungs, with a centrilobular distribution.  A few of the distal airways demonstrate debris within the airways.  No confluent airspace disease, pleural effusion, or pneumothorax.   Unremarkable appearance of the thoracic aorta without aneurysm, dissection flap, periaortic fluid. No significant atherosclerotic changes. Branch vessels are patent, with 3 vessel arch.  Unremarkable appearance of the main pulmonary artery, with no central, lobar, segmental, or subsegmental filling defects.  The distal pulmonary arteries are slightly more prominent than expected, greater than the size of the match to bronchi in the outer third of the lungs.  CT ABDOMEN AND PELVIS FINDINGS  Diffusely decreased attenuation/ enhancement of liver parenchyma with a cranial caudal span measuring 21 cm. Focal fatty sparing adjacent to the falciform ligament. Unremarkable appearance of spleen.  Unremarkable appearance of the bilateral adrenal glands.  Surgical changes of prior cholecystectomy.  No peripancreatic fluid or inflammatory changes.  No intrahepatic or extrahepatic biliary ductal dilatation.  No evidence of left or right hydronephrosis. No evidence of nephrolithiasis.  There are bilateral sub cm rounded hypodensities within the renal cortex, too small to completely characterize. No perinephric stranding.  No abnormally distended small bowel or colon. Enteric contrast reaches the distal colon  The terminal ileum is adjacent is segment 6, with the cecum looped into the liver hilum. The appendix is not identified, though there are no inflammatory changes adjacent to the cecum.  Multiple colonic diverticula in the sigmoid colon without associated inflammatory changes.  No free air or significant free fluid.  Several mediastinal lymph nodes adjacent to the esophagus, as noted above. Lymph nodes within the small bowel mesentery and preaortic/ para-aortic nodal stations, none of which are enlarged by CT size criteria.  Unremarkable appearance of the abdominal aorta without aneurysm dissection flap or periaortic fluid. Minimal atherosclerotic changes are present.  Mesenteric vessels are patent.  Evidence of fibroids  involving the uterus.  Trace endometrial fluid.  Low-density cystic lesion associated with the right adnexa measuring 5.3 cm. Unremarkable appearance of the left adnexa.  No displaced fracture. Mild degenerative changes of the lower lumbar spine.  IMPRESSION: Chest:  Multiple peripheral ground-glass/confluent nodules measuring no greater than 5 mm throughout the lungs in a distribution which, in the absence of known malignancy, is favored to represent inflammatory process such as bronchitis/bronchiolitis. Other sources of infection should be included on the differential including septic emboli. Follow-up imaging once the patient has cleared this acute process would be useful to assure resolution.  Mild bronchial wall thickening with a few small airways containing debris, supporting the diagnosis of bronchitis/bronchiolitis.  Circumferential thickening of the distal esophagus, with adjacent borderline enlarged lymph nodes. Correlation with a history of GERD and potentially upper endoscopy is recommended.  Abdomen:  No acute abdominal process.  5.3 cm cyst associated with the right adnexa. SRU consensus guidelines suggest following a cyst of this size with annual ultrasound whether premenopausal or postmenopausal. Initiation into surveillance is recommended.  Hepatomegaly and steatosis.  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  Signed,  Dulcy Fanny. Earleen Newport, DO  [facility]  [facility]   Electronically Signed   By: Corrie Mckusick D.O.   On: 04/09/2014 18:59   Ct Abdomen Pelvis W Contrast  04/09/2014   CLINICAL DATA:  54 year old female with a history of generalized weakness, dizziness, fever. Elevated LFTs.  EXAM: CT CHEST, ABDOMEN, AND PELVIS WITH CONTRAST  TECHNIQUE: Multidetector CT imaging of the chest, abdomen and pelvis was performed following the standard protocol during bolus administration of intravenous contrast.  CONTRAST:  144m  OMNIPAQUE IOHEXOL 350 MG/ML SOLN  COMPARISON:  None.  FINDINGS: CT CHEST FINDINGS  Unremarkable appearance of the superficial soft tissues of the chest.  Unremarkable appearance of the thoracic inlet and thyroid gland.  Axillary lymph nodes are present, though are not enlarged by CT size criteria. No supraclavicular adenopathy.  Central airways are patent with no debris. There are a few more peripheral airways with debris within the lumen (such as left lower lobe image 87 of series 402).  The arm lymph nodes within the mediastinum, not enlarged by CT size criteria.  Heart size within normal limits with no pericardial fluid/ thickening. No calcifications of the coronary vasculature.  There is circumferential thickening of the distal esophagus. Several lymph nodes are present in the periesophageal stations at the esophageal hiatus. Largest measures approximately 6 mm to 7 mm on image 80 of series 401.  Wedge-shaped ground-glass/reticular opacity at the periphery of the right upper lobe, abutting the minor fissure. Geographic ground-glass throughout the lungs, with some regions of hyper aerated lung.  Several ill-defined nodules are present throughout the lungs, including the largest in the left upper lobe on image 50 of series 402 measuring 5 mm. Majority of the nodules are at the periphery of the lungs, with a centrilobular distribution.  A few of the distal airways demonstrate debris within the airways.  No confluent airspace disease, pleural effusion, or pneumothorax.  Unremarkable appearance of the thoracic aorta without aneurysm, dissection flap, periaortic fluid. No significant atherosclerotic changes. Branch vessels are patent, with 3 vessel arch.  Unremarkable appearance of the main pulmonary artery, with no central, lobar, segmental, or subsegmental filling defects.  The distal pulmonary arteries are slightly more prominent than expected, greater than the size of the match to bronchi in the outer third of the  lungs.  CT ABDOMEN AND PELVIS FINDINGS  Diffusely decreased attenuation/ enhancement of liver parenchyma with a cranial caudal span measuring 21 cm. Focal fatty sparing adjacent to the falciform ligament. Unremarkable appearance of spleen.  Unremarkable appearance of the bilateral adrenal glands.  Surgical changes of prior cholecystectomy.  No peripancreatic fluid or inflammatory changes.  No intrahepatic or extrahepatic biliary ductal dilatation.  No evidence of left or right hydronephrosis. No evidence of nephrolithiasis.  There are bilateral sub cm rounded hypodensities within the renal cortex, too small to completely characterize. No perinephric stranding.  No abnormally distended small bowel or colon. Enteric contrast reaches the distal colon  The terminal ileum is adjacent is segment 6, with the cecum looped into the liver hilum. The appendix is not identified, though there are no inflammatory changes adjacent to the cecum.  Multiple colonic diverticula in the sigmoid colon without associated inflammatory changes.  No free air or significant free fluid.  Several mediastinal lymph nodes adjacent to the esophagus, as noted above. Lymph nodes within the small bowel mesentery and preaortic/ para-aortic nodal stations, none of which are enlarged by CT size criteria.  Unremarkable appearance of the abdominal aorta without aneurysm dissection flap  or periaortic fluid. Minimal atherosclerotic changes are present.  Mesenteric vessels are patent.  Evidence of fibroids involving the uterus.  Trace endometrial fluid.  Low-density cystic lesion associated with the right adnexa measuring 5.3 cm. Unremarkable appearance of the left adnexa.  No displaced fracture. Mild degenerative changes of the lower lumbar spine.  IMPRESSION: Chest:  Multiple peripheral ground-glass/confluent nodules measuring no greater than 5 mm throughout the lungs in a distribution which, in the absence of known malignancy, is favored to represent  inflammatory process such as bronchitis/bronchiolitis. Other sources of infection should be included on the differential including septic emboli. Follow-up imaging once the patient has cleared this acute process would be useful to assure resolution.  Mild bronchial wall thickening with a few small airways containing debris, supporting the diagnosis of bronchitis/bronchiolitis.  Circumferential thickening of the distal esophagus, with adjacent borderline enlarged lymph nodes. Correlation with a history of GERD and potentially upper endoscopy is recommended.  Abdomen:  No acute abdominal process.  5.3 cm cyst associated with the right adnexa. SRU consensus guidelines suggest following a cyst of this size with annual ultrasound whether premenopausal or postmenopausal. Initiation into surveillance is recommended.  Hepatomegaly and steatosis.  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  Signed,  Dulcy Fanny. Earleen Newport, DO  [facility]  [facility]   Electronically Signed   By: Corrie Mckusick D.O.   On: 04/09/2014 18:59    ASSESSMENT / PLAN:  Acute hypoxic respiratory failure Concern for influenza / viral syndrome CT chest suggestive of very mild GGO's with small scattered nodules - ? reactive vs inflammatory vs early ILD (given exposure to copper, silver, and dust) vs hypersensitivity pneumonitis vs welders lung (much less likely) Recs: Continue supplemental O2 as needed to maintain SpO2 > 92%. Agree with empiric tamiflu.   F/u cultures / RVP.  This presentation and hx strongly favor viral syndrome here. Send PCT, if elevated then only would consider adding abx (hx and exam do not support bacterial infection). F/u on ESR sent by primary team. Will hold off on additional inflammatory / autoimmune labs for now given only minor CT changes and the fact that pt is currently in an acute illness. If no improvement or pt worsens, then only would  consider / discuss whether repeat imaging is warranted to evaluate for changes and confirm no resolution. Thank you for the consult, PCCM will follow along for now.   Montey Hora, [facility] Pager: [phone]  or [phone] 04/09/2014, 11:43 PM

## 2014-04-09 NOTE — ED Notes (Signed)
Pt c/o generalized weakness, dizziness and fever with cough x 1 week

## 2014-04-09 NOTE — ED Notes (Addendum)
1620 - contacted CT to update that pt had LAC IV for CTA

## 2014-04-09 NOTE — ED Notes (Signed)
NP at BS.

## 2014-04-09 NOTE — ED Notes (Signed)
Called lab to add on lipase.  

## 2014-04-09 NOTE — H&P (Signed)
[facility] History and Physical  Andrea Villegas ZOX:[id number] DOB: [birth year] DOA: 04/09/2014  Referring physician: ER physician. PCP: No PCP Per Patient   Chief Complaint: Fever and weakness.  HPI: Andrea Villegas is a 54 y.o. female with no significant past medical history presents to the ER because of fever and weakness and nonproductive cough with some wheezing. Patient has been having these symptoms over the last 3-4 days. Denies any chest pain nausea vomiting abdominal pain or diarrhea. The ER patient was found to be febrile with temperatures around 103F and patient also was found to be mildly hypoxic. LFTs are mildly elevated. Patient had a CT angiogram of the chest and abdomen which shows diffuse nodules in the lung. Patient at this time has been admitted for further management. Patient states that she has recently been working in a new place which handles with copper.   Review of Systems: As presented in the history of presenting illness, rest negative.  Past Medical History  Diagnosis Date  . Medical history non-contributory    Past Surgical History  Procedure Laterality Date  . Cholecystectomy    . Tubal ligation     Social History:  reports that she has never smoked. She does not have any smokeless tobacco history on file. She reports that she does not drink alcohol or use illicit drugs. Where does patient live home. Can patient participate in ADLs? Yes.  No Known Allergies  Family History:  Family History  Problem Relation Age of Onset  . Diabetes Mellitus II Father   . CAD Father       Prior to Admission medications   Medication Sig Start Date End Date Taking? Authorizing Provider  Multiple Vitamin (MULTIVITAMIN WITH MINERALS) TABS Take 1 tablet by mouth daily.   Yes Historical Provider, MD  albuterol (PROVENTIL HFA;VENTOLIN HFA) 108 (90 BASE) MCG/ACT inhaler Inhale 2 puffs into the lungs every 4 (four) hours as needed for wheezing or shortness of breath.  04/09/14   Harle Battiest, NP  HYDROcodone-acetaminophen (NORCO/VICODIN) 5-325 MG per tablet Take 1 tablet by mouth every 4 (four) hours as needed. 04/09/14   Harle Battiest, NP  ondansetron (ZOFRAN) 4 MG tablet Take 1 tablet (4 mg total) by mouth every 6 (six) hours. 04/09/14   Harle Battiest, NP  oseltamivir (TAMIFLU) 75 MG capsule Take 1 capsule (75 mg total) by mouth every 12 (twelve) hours. 04/09/14   Harle Battiest, NP    Physical Exam: Filed Vitals:   04/09/14 2100 04/09/14 2130 04/09/14 2142 04/09/14 2207  BP:  105/65  120/66  Pulse: 110 108  108  Temp:   100.7 F (38.2 C) 103 F (39.4 C)  TempSrc:   Oral Oral  Resp: SpO2: 96% 95%  91%     General:  Moderately built and nourished.  Eyes: Anicteric no pallor.  ENT: No discharge from the ears eyes nose or mouth.  Neck: No mass felt.  Cardiovascular: S1 and S2 heard.  Respiratory: No rhonchi or crepitations.  Abdomen: Soft nontender bowel sounds present.  Skin: No rash.  Musculoskeletal: No edema.  Psychiatric: Appears normal.  Neurologic: Alert awake oriented to time place and person. Moves all extremities.  Labs on Admission:  Basic Metabolic Panel:  Recent Labs Lab 04/09/14 1108  NA 137  K 3.5  CL 99  CO2 29  GLUCOSE 145*  BUN 8  CREATININE 0.73  CALCIUM 9.2   Liver Function Tests:  Recent Labs Lab 04/09/14 1108  AST  57*  ALT 84*  ALKPHOS 133*  BILITOT 0.4  PROT 8.6*  ALBUMIN 4.4    Recent Labs Lab 04/09/14 1108  LIPASE 42   No results for input(s): AMMONIA in the last 168 hours. CBC:  Recent Labs Lab 04/09/14 1108  WBC 6.0  NEUTROABS 3.8  HGB 15.3*  HCT 46.2*  MCV 90.4  PLT 244   Cardiac Enzymes: No results for input(s): CKTOTAL, CKMB, CKMBINDEX, TROPONINI in the last 168 hours.  BNP (last 3 results) No results for input(s): BNP in the last 8760 hours.  ProBNP (last 3 results) No results for input(s): PROBNP in the last 8760  hours.  CBG: No results for input(s): GLUCAP in the last 168 hours.  Radiological Exams on Admission: Dg Chest 2 View  04/09/2014   CLINICAL DATA:  Dry cough for 4 days with shortness of Breath  EXAM: CHEST  2 VIEW  COMPARISON:  12/27/2013  FINDINGS: Cardiac shadow is within normal limits. The lungs are well aerated bilaterally. No focal infiltrate or sizable effusion is seen. No acute bony abnormality is noted.  IMPRESSION: No acute abnormality seen.   Electronically Signed   By: Alcide Clever M.D.   On: 04/09/2014 12:45   Ct Angio Chest Pe W/cm &/or Wo Cm  04/09/2014   CLINICAL DATA:  54 year old female with a history of generalized weakness, dizziness, fever. Elevated LFTs.  EXAM: CT CHEST, ABDOMEN, AND PELVIS WITH CONTRAST  TECHNIQUE: Multidetector CT imaging of the chest, abdomen and pelvis was performed following the standard protocol during bolus administration of intravenous contrast.  CONTRAST:  OMNIPAQUE IOHEXOL 350 MG/ML SOLN  COMPARISON:  None.  FINDINGS: CT CHEST FINDINGS  Unremarkable appearance of the superficial soft tissues of the chest.  Unremarkable appearance of the thoracic inlet and thyroid gland.  Axillary lymph nodes are present, though are not enlarged by CT size criteria. No supraclavicular adenopathy.  Central airways are patent with no debris. There are a few more peripheral airways with debris within the lumen (such as left lower lobe image 87 of series 402).  The arm lymph nodes within the mediastinum, not enlarged by CT size criteria.  Heart size within normal limits with no pericardial fluid/ thickening. No calcifications of the coronary vasculature.  There is circumferential thickening of the distal esophagus. Several lymph nodes are present in the periesophageal stations at the esophageal hiatus. Largest measures approximately 6 mm to 7 mm on image 80 of series 401.  Wedge-shaped ground-glass/reticular opacity at the periphery of the right upper lobe, abutting the  minor fissure. Geographic ground-glass throughout the lungs, with some regions of hyper aerated lung.  Several ill-defined nodules are present throughout the lungs, including the largest in the left upper lobe on image 50 of series 402 measuring 5 mm. Majority of the nodules are at the periphery of the lungs, with a centrilobular distribution.  A few of the distal airways demonstrate debris within the airways.  No confluent airspace disease, pleural effusion, or pneumothorax.  Unremarkable appearance of the thoracic aorta without aneurysm, dissection flap, periaortic fluid. No significant atherosclerotic changes. Branch vessels are patent, with 3 vessel arch.  Unremarkable appearance of the main pulmonary artery, with no central, lobar, segmental, or subsegmental filling defects.  The distal pulmonary arteries are slightly more prominent than expected, greater than the size of the match to bronchi in the outer third of the lungs.  CT ABDOMEN AND PELVIS FINDINGS  Diffusely decreased attenuation/ enhancement of liver parenchyma with a cranial  caudal span measuring 21 cm. Focal fatty sparing adjacent to the falciform ligament. Unremarkable appearance of spleen.  Unremarkable appearance of the bilateral adrenal glands.  Surgical changes of prior cholecystectomy.  No peripancreatic fluid or inflammatory changes.  No intrahepatic or extrahepatic biliary ductal dilatation.  No evidence of left or right hydronephrosis. No evidence of nephrolithiasis.  There are bilateral sub cm rounded hypodensities within the renal cortex, too small to completely characterize. No perinephric stranding.  No abnormally distended small bowel or colon. Enteric contrast reaches the distal colon  The terminal ileum is adjacent is segment 6, with the cecum looped into the liver hilum. The appendix is not identified, though there are no inflammatory changes adjacent to the cecum.  Multiple colonic diverticula in the sigmoid colon without  associated inflammatory changes.  No free air or significant free fluid.  Several mediastinal lymph nodes adjacent to the esophagus, as noted above. Lymph nodes within the small bowel mesentery and preaortic/ para-aortic nodal stations, none of which are enlarged by CT size criteria.  Unremarkable appearance of the abdominal aorta without aneurysm dissection flap or periaortic fluid. Minimal atherosclerotic changes are present.  Mesenteric vessels are patent.  Evidence of fibroids involving the uterus.  Trace endometrial fluid.  Low-density cystic lesion associated with the right adnexa measuring 5.3 cm. Unremarkable appearance of the left adnexa.  No displaced fracture. Mild degenerative changes of the lower lumbar spine.  IMPRESSION: Chest:  Multiple peripheral ground-glass/confluent nodules measuring no greater than 5 mm throughout the lungs in a distribution which, in the absence of known malignancy, is favored to represent inflammatory process such as bronchitis/bronchiolitis. Other sources of infection should be included on the differential including septic emboli. Follow-up imaging once the patient has cleared this acute process would be useful to assure resolution.  Mild bronchial wall thickening with a few small airways containing debris, supporting the diagnosis of bronchitis/bronchiolitis.  Circumferential thickening of the distal esophagus, with adjacent borderline enlarged lymph nodes. Correlation with a history of GERD and potentially upper endoscopy is recommended.  Abdomen:  No acute abdominal process.  5.3 cm cyst associated with the right adnexa. SRU consensus guidelines suggest following a cyst of this size with annual ultrasound whether premenopausal or postmenopausal. Initiation into surveillance is recommended.  Hepatomegaly and steatosis.  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  Signed,  Yvone NeuJaime S. Loreta AveWagner, DO  [facility]   [facility]   Electronically Signed   By: Gilmer MorJaime  Wagner D.O.   On: 04/09/2014 18:59   Ct Abdomen Pelvis W Contrast  04/09/2014   CLINICAL DATA:  54 year old female with a history of generalized weakness, dizziness, fever. Elevated LFTs.  EXAM: CT CHEST, ABDOMEN, AND PELVIS WITH CONTRAST  TECHNIQUE: Multidetector CT imaging of the chest, abdomen and pelvis was performed following the standard protocol during bolus administration of intravenous contrast.  CONTRAST:  100mL OMNIPAQUE IOHEXOL 350 MG/ML SOLN  COMPARISON:  None.  FINDINGS: CT CHEST FINDINGS  Unremarkable appearance of the superficial soft tissues of the chest.  Unremarkable appearance of the thoracic inlet and thyroid gland.  Axillary lymph nodes are present, though are not enlarged by CT size criteria. No supraclavicular adenopathy.  Central airways are patent with no debris. There are a few more peripheral airways with debris within the lumen (such as left lower lobe image 87 of series 402).  The arm lymph nodes within the mediastinum, not enlarged by CT size criteria.  Heart size within normal limits with no pericardial  fluid/ thickening. No calcifications of the coronary vasculature.  There is circumferential thickening of the distal esophagus. Several lymph nodes are present in the periesophageal stations at the esophageal hiatus. Largest measures approximately 6 mm to 7 mm on image 80 of series 401.  Wedge-shaped ground-glass/reticular opacity at the periphery of the right upper lobe, abutting the minor fissure. Geographic ground-glass throughout the lungs, with some regions of hyper aerated lung.  Several ill-defined nodules are present throughout the lungs, including the largest in the left upper lobe on image 50 of series 402 measuring 5 mm. Majority of the nodules are at the periphery of the lungs, with a centrilobular distribution.  A few of the distal airways demonstrate debris within the airways.  No confluent airspace disease,  pleural effusion, or pneumothorax.  Unremarkable appearance of the thoracic aorta without aneurysm, dissection flap, periaortic fluid. No significant atherosclerotic changes. Branch vessels are patent, with 3 vessel arch.  Unremarkable appearance of the main pulmonary artery, with no central, lobar, segmental, or subsegmental filling defects.  The distal pulmonary arteries are slightly more prominent than expected, greater than the size of the match to bronchi in the outer third of the lungs.  CT ABDOMEN AND PELVIS FINDINGS  Diffusely decreased attenuation/ enhancement of liver parenchyma with a cranial caudal span measuring 21 cm. Focal fatty sparing adjacent to the falciform ligament. Unremarkable appearance of spleen.  Unremarkable appearance of the bilateral adrenal glands.  Surgical changes of prior cholecystectomy.  No peripancreatic fluid or inflammatory changes.  No intrahepatic or extrahepatic biliary ductal dilatation.  No evidence of left or right hydronephrosis. No evidence of nephrolithiasis.  There are bilateral sub cm rounded hypodensities within the renal cortex, too small to completely characterize. No perinephric stranding.  No abnormally distended small bowel or colon. Enteric contrast reaches the distal colon  The terminal ileum is adjacent is segment 6, with the cecum looped into the liver hilum. The appendix is not identified, though there are no inflammatory changes adjacent to the cecum.  Multiple colonic diverticula in the sigmoid colon without associated inflammatory changes.  No free air or significant free fluid.  Several mediastinal lymph nodes adjacent to the esophagus, as noted above. Lymph nodes within the small bowel mesentery and preaortic/ para-aortic nodal stations, none of which are enlarged by CT size criteria.  Unremarkable appearance of the abdominal aorta without aneurysm dissection flap or periaortic fluid. Minimal atherosclerotic changes are present.  Mesenteric vessels are  patent.  Evidence of fibroids involving the uterus.  Trace endometrial fluid.  Low-density cystic lesion associated with the right adnexa measuring 5.3 cm. Unremarkable appearance of the left adnexa.  No displaced fracture. Mild degenerative changes of the lower lumbar spine.  IMPRESSION: Chest:  Multiple peripheral ground-glass/confluent nodules measuring no greater than 5 mm throughout the lungs in a distribution which, in the absence of known malignancy, is favored to represent inflammatory process such as bronchitis/bronchiolitis. Other sources of infection should be included on the differential including septic emboli. Follow-up imaging once the patient has cleared this acute process would be useful to assure resolution.  Mild bronchial wall thickening with a few small airways containing debris, supporting the diagnosis of bronchitis/bronchiolitis.  Circumferential thickening of the distal esophagus, with adjacent borderline enlarged lymph nodes. Correlation with a history of GERD and potentially upper endoscopy is recommended.  Abdomen:  No acute abdominal process.  5.3 cm cyst associated with the right adnexa. SRU consensus guidelines suggest following a cyst of this size with annual ultrasound  whether premenopausal or postmenopausal. Initiation into surveillance is recommended.  Hepatomegaly and steatosis.  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  Signed,  Yvone Neu. Loreta Ave, DO  [facility]  [facility]   Electronically Signed   By: Gilmer Mor D.O.   On: 04/09/2014 18:59    EKG: Independently reviewed. Sinus tachycardia with nonspecific ST changes.  Assessment/Plan Principal Problem:   Acute respiratory failure with hypoxia Active Problems:   Elevated LFTs   SIRS (systemic inflammatory response syndrome)   1. SIRS - source of infection not clear. Check blood cultures urine cultures and influenza PCR and respiratory viral  panel. Patient has been empirically started on Tamiflu for now. Continue hydration. 2. Acute respiratory failure with hypoxia - given the CT scan findings with diffuse nodules I have consulted pulmonary critical care to get their input. See #1. Patient is on empiric Tamiflu. Check sedimentation rate. 3. Elevated LFTs - probably secondary to #1. Check acute hepatitis panel and follow LFTs closely. 4. Ovarian cyst - will need close follow-up as outpatient. 5. Thickened distal esophagus - 11 to need EGD as outpatient.   DVT Prophylaxis SCDs for now. If pulmonary is not planning any intervention that may place patient on Lovenox.  Code Status: Full code.  Family Communication: Patient's husband.  Disposition Plan: Admit to inpatient.    KAKRAKANDY,ARSHAD N. [facility] Pager [phone].  If 7PM-7AM, please contact night-coverage [email or web address] Password [facility] 04/09/2014, 10:41 PM

## 2014-04-09 NOTE — ED Provider Notes (Signed)
CSN: [id number]     Arrival date & time 04/09/14  1055 History   First MD Initiated Contact with Patient 04/09/14 1218     Chief Complaint  Patient presents with  . Fever  . Weakness     (Consider location/radiation/quality/duration/timing/severity/associated sxs/prior Treatment) HPI    PCP: No PCP Per Patient Blood pressure 118/69, pulse 107, temperature 102.4 F (39.1 C), temperature source Oral, resp. rate 19, SpO2 98 %.  Andrea Villegas is a 54 y.o.female with a significant PMH of cholecystectomy (2001) presents to the ER with multiple vague  Complaints. She complains of generalized weakness, dizziness, cough and fever for 2 days. She started to feel weak 1 week ago while visiting in [place] but recently she started to feel much worse. Last night she reports hearing audible wheezing with SOB and pain all over. On arrival she has fever at 102.6, tachycardia at 116. Oxygen saturation is 98% on room air. Pt endorsing some vague abdominal pain to right and left upper abdomen, body aches, myalgias, arthralgias.  Negative Review of Symptoms: headache, neck pain, confusion, back pain, vision changes, anorexia, ear pain, sore throat, nausea, vomiting or diarrhea.   History reviewed. No pertinent past medical history. Past Surgical History  Procedure Laterality Date  . Cholecystectomy    . Tubal ligation     History reviewed. No pertinent family history. History  Substance Use Topics  . Smoking status: Former Research scientist (life sciences)  . Smokeless tobacco: Not on file  . Alcohol Use: No   OB History    No data available     Review of Systems  10 Systems reviewed and are negative for acute change except as noted in the HPI.   Allergies  Review of patient's allergies indicates no known allergies.  Home Medications   Prior to Admission medications   Medication Sig Start Date End Date Taking? Authorizing Provider  azithromycin (ZITHROMAX) 250 MG tablet Take 1 tablet (250 mg total) by mouth  daily. Take 1 tab PO x 4 days 12/27/13   Carman Ching, PA-C  chlorpheniramine-HYDROcodone (TUSSIONEX PENNKINETIC ER) 10-8 MG/5ML LQCR Take 5 mLs by mouth every 12 (twelve) hours as needed for cough. 12/27/13   Carman Ching, PA-C  diazepam (VALIUM) 5 MG tablet Take 1 tablet (5 mg total) by mouth 2 (two) times daily. Patient not taking: Reported on 12/27/2013 08/18/12   Coralee North, PA-C  HYDROcodone-acetaminophen (NORCO/VICODIN) 5-325 MG per tablet Take 1-2 tablets by mouth every 4 (four) hours as needed. 12/27/13   Carman Ching, PA-C  Multiple Vitamin (MULTIVITAMIN WITH MINERALS) TABS Take 1 tablet by mouth daily.    Historical Provider, MD   BP 101/63 mmHg  Pulse 99  Temp(Src) 99.7 F (37.6 C) (Oral)  Resp 18  SpO2 99% Physical Exam  Constitutional: She is oriented to person, place, and time. She appears well-developed and well-nourished. She appears ill. No distress.  HENT:  Head: Normocephalic and atraumatic.  Right Ear: Tympanic membrane and ear canal normal.  Left Ear: Tympanic membrane and ear canal normal.  Nose: Nose normal.  Mouth/Throat: Uvula is midline and oropharynx is clear and moist.  Eyes: Pupils are equal, round, and reactive to light.  Neck: Normal range of motion. Neck supple. No spinous process tenderness and no muscular tenderness present. Normal range of motion present.  Cardiovascular: Regular rhythm.  Tachycardia present.   Pulmonary/Chest: Effort normal. She has no decreased breath sounds. She has no wheezes. She exhibits no tenderness and no crepitus.  Abdominal:  Soft. Bowel sounds are normal. She exhibits no distension. There is tenderness in the right upper quadrant, epigastric area and left upper quadrant. There is no rigidity, no rebound, no guarding and no CVA tenderness.  Neurological: She is alert and oriented to person, place, and time.  Skin: Skin is warm and dry.  Psychiatric: Her mood appears anxious.  Nursing note and vitals reviewed.   ED Course   Procedures (including critical care time) Labs Review Labs Reviewed  CBC WITH DIFFERENTIAL/PLATELET - Abnormal; Notable for the following:    Hemoglobin 15.3 (*)    HCT 46.2 (*)    All other components within normal limits  COMPREHENSIVE METABOLIC PANEL - Abnormal; Notable for the following:    Glucose, Bld 145 (*)    Total Protein 8.6 (*)    AST 57 (*)    ALT 84 (*)    Alkaline Phosphatase 133 (*)    All other components within normal limits  URINALYSIS, ROUTINE W REFLEX MICROSCOPIC - Abnormal; Notable for the following:    Protein, ur 30 (*)    All other components within normal limits  URINE MICROSCOPIC-ADD ON - Abnormal; Notable for the following:    Squamous Epithelial / LPF FEW (*)    Bacteria, UA FEW (*)    All other components within normal limits  I-STAT CG4 LACTIC ACID, ED - Abnormal; Notable for the following:    Lactic Acid, Venous 2.20 (*)    All other components within normal limits  LIPASE, BLOOD    Imaging Review Dg Chest 2 View  04/09/2014   CLINICAL DATA:  Dry cough for 4 days with shortness of Breath  EXAM: CHEST  2 VIEW  COMPARISON:  12/27/2013  FINDINGS: Cardiac shadow is within normal limits. The lungs are well aerated bilaterally. No focal infiltrate or sizable effusion is seen. No acute bony abnormality is noted.  IMPRESSION: No acute abnormality seen.   Electronically Signed   By: Inez Catalina M.D.   On: 04/09/2014 12:45     EKG Interpretation   Date/Time:  Wednesday April 09 2014 10:59:14 EST Ventricular Rate:  117 PR Interval:  130 QRS Duration: 84 QT Interval:  264 QTC Calculation: 368 R Axis:   79 Text Interpretation:  Sinus tachycardia Nonspecific ST and T wave  abnormality Abnormal ECG Since last tracing rate faster Confirmed by YAO   MD, DAVID ([id number]) on 04/09/2014 1:42:52 PM      MDM   Final diagnoses:  Abdominal pain  Shortness of breath   1:56pm Patient presents with fever, tachycardia and flu like symptoms.  She has  positive protein in her urinalysis with 30 or protein. Mildly elevated lactate. Chest xray does not show any acute changes. CBC and CMP is normal aside from AST and ALT as well as Alk Phos are elevated. She had cholecystectomy 15 years ago- doubt retained stone. I discussed the case with Dr. Darl Householder, I will obtain CT abd/pelv for further evaluation.  4:00 pm Patient will get a CT angio chest and ct abdomen pelv as she has dropped her oxygen saturation to 92% on room air. At end of shift Clemens Catholic, PA-C has agreed to follow-up on this patient, we discussed case together with Dr. Darl Householder. Pt will have a low threshold for admittance or obs.  Linus Mako, PA-C 04/09/14 1601  Wandra Arthurs, MD 04/10/14 1240

## 2014-04-09 NOTE — ED Notes (Signed)
Pt placed back on 3L 02.

## 2014-04-10 DIAGNOSIS — R0602 Shortness of breath: Secondary | ICD-10-CM | POA: Insufficient documentation

## 2014-04-10 DIAGNOSIS — B349 Viral infection, unspecified: Secondary | ICD-10-CM

## 2014-04-10 DIAGNOSIS — R509 Fever, unspecified: Secondary | ICD-10-CM | POA: Insufficient documentation

## 2014-04-10 LAB — INFLUENZA PANEL BY PCR (TYPE A & B)
H1N1FLUPCR: DETECTED — AB
INFLBPCR: NEGATIVE
Influenza A By PCR: POSITIVE — AB

## 2014-04-10 LAB — HEPATITIS PANEL, ACUTE
HCV Ab: NEGATIVE
HEP A IGM: NONREACTIVE
Hep B C IgM: NONREACTIVE
Hepatitis B Surface Ag: NEGATIVE

## 2014-04-10 LAB — CBC WITH DIFFERENTIAL/PLATELET
BASOS PCT: 0 % (ref 0–1)
Basophils Absolute: 0 10*3/uL (ref 0.0–0.1)
EOS ABS: 0 10*3/uL (ref 0.0–0.7)
EOS PCT: 0 % (ref 0–5)
HEMATOCRIT: 40.1 % (ref 36.0–46.0)
Hemoglobin: 13.1 g/dL (ref 12.0–15.0)
Lymphocytes Relative: 35 % (ref 12–46)
Lymphs Abs: 2.1 10*3/uL (ref 0.7–4.0)
MCH: 29.6 pg (ref 26.0–34.0)
MCHC: 32.7 g/dL (ref 30.0–36.0)
MCV: 90.5 fL (ref 78.0–100.0)
MONO ABS: 0.3 10*3/uL (ref 0.1–1.0)
Monocytes Relative: 5 % (ref 3–12)
Neutro Abs: 3.5 10*3/uL (ref 1.7–7.7)
Neutrophils Relative %: 60 % (ref 43–77)
Platelets: 203 10*3/uL (ref 150–400)
RBC: 4.43 MIL/uL (ref 3.87–5.11)
RDW: 14.3 % (ref 11.5–15.5)
WBC: 5.9 10*3/uL (ref 4.0–10.5)

## 2014-04-10 LAB — COMPREHENSIVE METABOLIC PANEL
ALT: 84 U/L — ABNORMAL HIGH (ref 0–35)
AST: 66 U/L — ABNORMAL HIGH (ref 0–37)
Albumin: 3.4 g/dL — ABNORMAL LOW (ref 3.5–5.2)
Alkaline Phosphatase: 140 U/L — ABNORMAL HIGH (ref 39–117)
Anion gap: 12 (ref 5–15)
BUN: 6 mg/dL (ref 6–23)
CO2: 23 mmol/L (ref 19–32)
CREATININE: 0.61 mg/dL (ref 0.50–1.10)
Calcium: 8.3 mg/dL — ABNORMAL LOW (ref 8.4–10.5)
Chloride: 99 mmol/L (ref 96–112)
GLUCOSE: 112 mg/dL — AB (ref 70–99)
Potassium: 3.9 mmol/L (ref 3.5–5.1)
Sodium: 134 mmol/L — ABNORMAL LOW (ref 135–145)
Total Bilirubin: 0.5 mg/dL (ref 0.3–1.2)
Total Protein: 7.1 g/dL (ref 6.0–8.3)

## 2014-04-10 LAB — BRAIN NATRIURETIC PEPTIDE: B NATRIURETIC PEPTIDE 5: 113.6 pg/mL — AB (ref 0.0–100.0)

## 2014-04-10 LAB — TROPONIN I: Troponin I: <0.03 ng/mL (ref <0.031)

## 2014-04-10 LAB — LACTIC ACID, PLASMA: LACTIC ACID, VENOUS: 1.1 mmol/L (ref 0.5–2.0)

## 2014-04-10 LAB — SEDIMENTATION RATE: Sed Rate: 15 mm/hr (ref 0–22)

## 2014-04-10 MED ORDER — PANTOPRAZOLE SODIUM 40 MG PO TBEC
40.0000 mg | DELAYED_RELEASE_TABLET | Freq: Every day | ORAL | Status: DC
  Administered 2014-04-10 – 2014-04-12 (×3): 40 mg via ORAL
  Filled 2014-04-10 (×3): qty 1

## 2014-04-10 MED ORDER — LEVOFLOXACIN IN D5W 750 MG/150ML IV SOLN
750.0000 mg | INTRAVENOUS | Status: DC
  Administered 2014-04-10 – 2014-04-12 (×3): 750 mg via INTRAVENOUS
  Filled 2014-04-10 (×3): qty 150

## 2014-04-10 MED ORDER — ALUM & MAG HYDROXIDE-SIMETH 200-200-20 MG/5ML PO SUSP
30.0000 mL | Freq: Four times a day (QID) | ORAL | Status: DC | PRN
  Administered 2014-04-10: 30 mL via ORAL
  Filled 2014-04-10: qty 30

## 2014-04-10 NOTE — Progress Notes (Signed)
Utilization review completed. Skylynne Schlechter, RN, BSN. 

## 2014-04-10 NOTE — Progress Notes (Signed)
PROGRESS NOTE  Andrea Villegas HEN:[id number] DOB: [birth year] DOA: 04/09/2014 PCP: No PCP Per Patient  HPI/Subjective: Andrea Villegas is a 54 year old female with no significant past medical history who presented to the [facility] ED on 04/09/14 with 3-4 day history of fever, generalized weakness, nonproductive cough, and dizziness. Prior to admission the patient's symptoms worsened and she began experiencing SOB with wheezing and global myalgias.   In the ED, patient met SIRS criteria with fever of 102.6 and tachycardia at 116. Patient's O2 saturation dropped to 92% which prompted more extensive work up. CXR showed no acute changes. CT of abdomen showed hepatomegaly and steatosis as well as ovarian cyst. CT of chest showed diffuse nodules in the lungs. Lactic acid was 2.20. LFT's were elevated at AST 66 and ALT 84. Blood and urine cultures were collected. Patient was started on Tamiflu and Duo-Neb.   Today, Ms. Andrea Villegas is still not feeling well. She is still symptomatic with myalgias and cough. Patient is febrile with a temp of 101.4F and max temp of 103F overnight. She is still tachycardic with HR of 100 and has consistently had O2 saturation in the low to mid 90's with most recent reading 93% on 3L via nasal cannula.   Assessment/Plan:   Acute respiratory failure with hypoxia: Likely secondary to Influenza vs CAP. Continue supportive care.    ?TIR:WERXVQMG lung nodules, empirically started on Levaquin    Elevated LFTs: AST 66, ALT 84. Hepatitis panel pending. Monitor with daily CMP.     SIRS (systemic inflammatory response syndrome): Patient is currently febrile (101.4F) and tachycardic (100 bpm). Likely due to respiratory infection. Continue with tylenol 664m Q6H for fever. Continue tamiflu and levaquin. Expect improvement with resolution of infection. Await cultures  Ovarian cyst: 5.3 cm cyst associated with the right adnexa,will need close follow-up as outpatient.     Thickened distal  esophagus :EGD as outpatient.  DVT Prophylaxis:  SCDs  Code Status: Full  Family Communication: Patient's husband was at bedside Disposition Plan: Inpatient   Consultants:  None  Procedures:  None  Antibiotics: Anti-infectives    Start     Dose/Rate Route Frequency Ordered Stop   04/10/14 1000  levofloxacin (LEVAQUIN) IVPB 750 mg     750 mg 100 mL/hr over 90 Minutes Intravenous Every 24 hours 04/10/14 0925     04/09/14 2330  oseltamivir (TAMIFLU) capsule 75 mg     75 mg Oral 2 times daily 04/09/14 2241     04/09/14 1400  oseltamivir (TAMIFLU) capsule 75 mg     75 mg Oral  Once 04/09/14 1348 04/09/14 1422   04/09/14 0000  oseltamivir (TAMIFLU) 75 MG capsule     75 mg Oral Every 12 hours 04/09/14 1925        Objective: Filed Vitals:   04/09/14 2130 04/09/14 2142 04/09/14 2207 04/10/14 0500  BP: 105/65  120/66 106/65  Pulse: 108  108 100  Temp:  100.7 F (38.2 C) 103 F (39.4 C) 101.4 F (38.6 C)  TempSrc:  Oral Oral Oral  Resp: _0 Height:    _1  (1.499 m)  Weight:    77.2 kg (170 lb 3.1 oz)  SpO2: 95%  91% 93%    Intake/Output Summary (Last 24 hours) at 04/10/14 1048 Last data filed at 04/10/14 0800  Gross per 24 hour  Intake 873.33 ml  Output    800 ml  Net  73.33 ml   FAutoliv  04/10/14  0500  Weight: 77.2 kg (170 lb 3.1 oz)    Exam: General: Well developed, well nourished, NAD, appears stated age  29:  PERR, Anicteic Sclera, MMM. No pharyngeal erythema or exudates  Neck: Supple, no JVD, no masses  Cardiovascular: RRR, S1 S2 auscultated, no rubs, murmurs or gallops.   Respiratory: Clear to auscultation bilaterally with equal chest rise  Abdomen: Soft, nontender, nondistended, + bowel sounds  Extremities: warm dry without cyanosis clubbing or edema.  Neuro: AAOx3, cranial nerves grossly intact. Strength 5/5 in upper and lower extremities  Skin: Without rashes exudates or nodules.   Psych: Normal affect and demeanor with intact  judgement and insight   Data Reviewed: Basic Metabolic Panel:  Recent Labs Lab 04/09/14 1108 04/10/14 0549  NA 137 134*  K 3.5 3.9  CL 99 99  CO2 29 23  GLUCOSE 145* 112*  BUN 8 6  CREATININE 0.73 0.61  CALCIUM 9.2 8.3*   Liver Function Tests:  Recent Labs Lab 04/09/14 1108 04/10/14 0549  AST 57* 66*  ALT 84* 84*  ALKPHOS 133* 140*  BILITOT 0.4 0.5  PROT 8.6* 7.1  ALBUMIN 4.4 3.4*    Recent Labs Lab 04/09/14 1108  LIPASE 42   CBC:  Recent Labs Lab 04/09/14 1108 04/10/14 0549  WBC 6.0 5.9  NEUTROABS 3.8 3.5  HGB 15.3* 13.1  HCT 46.2* 40.1  MCV 90.4 90.5  PLT 244 203   Cardiac Enzymes:  Recent Labs Lab 04/09/14 0006  TROPONINI <0.03   BNP (last 3 results)  Recent Labs  04/09/14 0006  BNP 113.6*   Studies: Dg Chest 2 View  04/09/2014   CLINICAL DATA:  Dry cough for 4 days with shortness of Breath  EXAM: CHEST  2 VIEW  COMPARISON:  12/27/2013  FINDINGS: Cardiac shadow is within normal limits. The lungs are well aerated bilaterally. No focal infiltrate or sizable effusion is seen. No acute bony abnormality is noted.  IMPRESSION: No acute abnormality seen.   Electronically Signed   By: Inez Catalina M.D.   On: 04/09/2014 12:45   Ct Angio Chest Pe W/cm &/or Wo Cm  04/09/2014   CLINICAL DATA:  54 year old female with a history of generalized weakness, dizziness, fever. Elevated LFTs.  EXAM: CT CHEST, ABDOMEN, AND PELVIS WITH CONTRAST  TECHNIQUE: Multidetector CT imaging of the chest, abdomen and pelvis was performed following the standard protocol during bolus administration of intravenous contrast.  CONTRAST:  129m OMNIPAQUE IOHEXOL 350 MG/ML SOLN  COMPARISON:  None.  FINDINGS: CT CHEST FINDINGS  Unremarkable appearance of the superficial soft tissues of the chest.  Unremarkable appearance of the thoracic inlet and thyroid gland.  Axillary lymph nodes are present, though are not enlarged by CT size criteria. No supraclavicular adenopathy.  Central  airways are patent with no debris. There are a few more peripheral airways with debris within the lumen (such as left lower lobe image 87 of series 402).  The arm lymph nodes within the mediastinum, not enlarged by CT size criteria.  Heart size within normal limits with no pericardial fluid/ thickening. No calcifications of the coronary vasculature.  There is circumferential thickening of the distal esophagus. Several lymph nodes are present in the periesophageal stations at the esophageal hiatus. Largest measures approximately 6 mm to 7 mm on image 80 of series 401.  Wedge-shaped ground-glass/reticular opacity at the periphery of the right upper lobe, abutting the minor fissure. Geographic ground-glass throughout the lungs, with some regions of hyper aerated lung.  Several  ill-defined nodules are present throughout the lungs, including the largest in the left upper lobe on image 50 of series 402 measuring 5 mm. Majority of the nodules are at the periphery of the lungs, with a centrilobular distribution.  A few of the distal airways demonstrate debris within the airways.  No confluent airspace disease, pleural effusion, or pneumothorax.  Unremarkable appearance of the thoracic aorta without aneurysm, dissection flap, periaortic fluid. No significant atherosclerotic changes. Branch vessels are patent, with 3 vessel arch.  Unremarkable appearance of the main pulmonary artery, with no central, lobar, segmental, or subsegmental filling defects.  The distal pulmonary arteries are slightly more prominent than expected, greater than the size of the match to bronchi in the outer third of the lungs.  CT ABDOMEN AND PELVIS FINDINGS  Diffusely decreased attenuation/ enhancement of liver parenchyma with a cranial caudal span measuring 21 cm. Focal fatty sparing adjacent to the falciform ligament. Unremarkable appearance of spleen.  Unremarkable appearance of the bilateral adrenal glands.  Surgical changes of prior  cholecystectomy.  No peripancreatic fluid or inflammatory changes.  No intrahepatic or extrahepatic biliary ductal dilatation.  No evidence of left or right hydronephrosis. No evidence of nephrolithiasis.  There are bilateral sub cm rounded hypodensities within the renal cortex, too small to completely characterize. No perinephric stranding.  No abnormally distended small bowel or colon. Enteric contrast reaches the distal colon  The terminal ileum is adjacent is segment 6, with the cecum looped into the liver hilum. The appendix is not identified, though there are no inflammatory changes adjacent to the cecum.  Multiple colonic diverticula in the sigmoid colon without associated inflammatory changes.  No free air or significant free fluid.  Several mediastinal lymph nodes adjacent to the esophagus, as noted above. Lymph nodes within the small bowel mesentery and preaortic/ para-aortic nodal stations, none of which are enlarged by CT size criteria.  Unremarkable appearance of the abdominal aorta without aneurysm dissection flap or periaortic fluid. Minimal atherosclerotic changes are present.  Mesenteric vessels are patent.  Evidence of fibroids involving the uterus.  Trace endometrial fluid.  Low-density cystic lesion associated with the right adnexa measuring 5.3 cm. Unremarkable appearance of the left adnexa.  No displaced fracture. Mild degenerative changes of the lower lumbar spine.  IMPRESSION: Chest:  Multiple peripheral ground-glass/confluent nodules measuring no greater than 5 mm throughout the lungs in a distribution which, in the absence of known malignancy, is favored to represent inflammatory process such as bronchitis/bronchiolitis. Other sources of infection should be included on the differential including septic emboli. Follow-up imaging once the patient has cleared this acute process would be useful to assure resolution.  Mild bronchial wall thickening with a few small airways containing debris,  supporting the diagnosis of bronchitis/bronchiolitis.  Circumferential thickening of the distal esophagus, with adjacent borderline enlarged lymph nodes. Correlation with a history of GERD and potentially upper endoscopy is recommended.  Abdomen:  No acute abdominal process.  5.3 cm cyst associated with the right adnexa. SRU consensus guidelines suggest following a cyst of this size with annual ultrasound whether premenopausal or postmenopausal. Initiation into surveillance is recommended.  Hepatomegaly and steatosis.  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  Signed,  Dulcy Fanny. Earleen Newport, DO  [facility]  [facility]   Electronically Signed   By: Corrie Mckusick D.O.   On: 04/09/2014 18:59   Ct Abdomen Pelvis W Contrast  04/09/2014   CLINICAL DATA:  54 year old female with a history of generalized  weakness, dizziness, fever. Elevated LFTs.  EXAM: CT CHEST, ABDOMEN, AND PELVIS WITH CONTRAST  TECHNIQUE: Multidetector CT imaging of the chest, abdomen and pelvis was performed following the standard protocol during bolus administration of intravenous contrast.  CONTRAST:  144m OMNIPAQUE IOHEXOL 350 MG/ML SOLN  COMPARISON:  None.  FINDINGS: CT CHEST FINDINGS  Unremarkable appearance of the superficial soft tissues of the chest.  Unremarkable appearance of the thoracic inlet and thyroid gland.  Axillary lymph nodes are present, though are not enlarged by CT size criteria. No supraclavicular adenopathy.  Central airways are patent with no debris. There are a few more peripheral airways with debris within the lumen (such as left lower lobe image 87 of series 402).  The arm lymph nodes within the mediastinum, not enlarged by CT size criteria.  Heart size within normal limits with no pericardial fluid/ thickening. No calcifications of the coronary vasculature.  There is circumferential thickening of the distal esophagus. Several lymph nodes are present in  the periesophageal stations at the esophageal hiatus. Largest measures approximately 6 mm to 7 mm on image 80 of series 401.  Wedge-shaped ground-glass/reticular opacity at the periphery of the right upper lobe, abutting the minor fissure. Geographic ground-glass throughout the lungs, with some regions of hyper aerated lung.  Several ill-defined nodules are present throughout the lungs, including the largest in the left upper lobe on image 50 of series 402 measuring 5 mm. Majority of the nodules are at the periphery of the lungs, with a centrilobular distribution.  A few of the distal airways demonstrate debris within the airways.  No confluent airspace disease, pleural effusion, or pneumothorax.  Unremarkable appearance of the thoracic aorta without aneurysm, dissection flap, periaortic fluid. No significant atherosclerotic changes. Branch vessels are patent, with 3 vessel arch.  Unremarkable appearance of the main pulmonary artery, with no central, lobar, segmental, or subsegmental filling defects.  The distal pulmonary arteries are slightly more prominent than expected, greater than the size of the match to bronchi in the outer third of the lungs.  CT ABDOMEN AND PELVIS FINDINGS  Diffusely decreased attenuation/ enhancement of liver parenchyma with a cranial caudal span measuring 21 cm. Focal fatty sparing adjacent to the falciform ligament. Unremarkable appearance of spleen.  Unremarkable appearance of the bilateral adrenal glands.  Surgical changes of prior cholecystectomy.  No peripancreatic fluid or inflammatory changes.  No intrahepatic or extrahepatic biliary ductal dilatation.  No evidence of left or right hydronephrosis. No evidence of nephrolithiasis.  There are bilateral sub cm rounded hypodensities within the renal cortex, too small to completely characterize. No perinephric stranding.  No abnormally distended small bowel or colon. Enteric contrast reaches the distal colon  The terminal ileum is  adjacent is segment 6, with the cecum looped into the liver hilum. The appendix is not identified, though there are no inflammatory changes adjacent to the cecum.  Multiple colonic diverticula in the sigmoid colon without associated inflammatory changes.  No free air or significant free fluid.  Several mediastinal lymph nodes adjacent to the esophagus, as noted above. Lymph nodes within the small bowel mesentery and preaortic/ para-aortic nodal stations, none of which are enlarged by CT size criteria.  Unremarkable appearance of the abdominal aorta without aneurysm dissection flap or periaortic fluid. Minimal atherosclerotic changes are present.  Mesenteric vessels are patent.  Evidence of fibroids involving the uterus.  Trace endometrial fluid.  Low-density cystic lesion associated with the right adnexa measuring 5.3 cm. Unremarkable appearance of the left adnexa.  No  displaced fracture. Mild degenerative changes of the lower lumbar spine.  IMPRESSION: Chest:  Multiple peripheral ground-glass/confluent nodules measuring no greater than 5 mm throughout the lungs in a distribution which, in the absence of known malignancy, is favored to represent inflammatory process such as bronchitis/bronchiolitis. Other sources of infection should be included on the differential including septic emboli. Follow-up imaging once the patient has cleared this acute process would be useful to assure resolution.  Mild bronchial wall thickening with a few small airways containing debris, supporting the diagnosis of bronchitis/bronchiolitis.  Circumferential thickening of the distal esophagus, with adjacent borderline enlarged lymph nodes. Correlation with a history of GERD and potentially upper endoscopy is recommended.  Abdomen:  No acute abdominal process.  5.3 cm cyst associated with the right adnexa. SRU consensus guidelines suggest following a cyst of this size with annual ultrasound whether premenopausal or postmenopausal.  Initiation into surveillance is recommended.  Hepatomegaly and steatosis.  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  Signed,  Dulcy Fanny. Earleen Newport, DO  [facility]  [facility]   Electronically Signed   By: Corrie Mckusick D.O.   On: 04/09/2014 18:59    Scheduled Meds: . levofloxacin (LEVAQUIN) IV  750 mg Intravenous Q24H  . oseltamivir  75 mg Oral BID   Continuous Infusions: . sodium chloride 100 mL/hr at 04/10/14 9532    Principal Problem:   Acute respiratory failure with hypoxia Active Problems:   Elevated LFTs   SIRS (systemic inflammatory response syndrome)   Shortness of breath   Pyrexia   Viral illness  Raspect, Erin, PA-S [facility] 04/10/2014, 10:48 AM   Attending Patient was seen, examined,treatment plan was discussed with Ms Raspect.  I have directly reviewed the clinical findings, lab, imaging studies and management of this patient in detail. I have made the necessary changes to the above noted documentation, and agree with the documentation, as recorded by Ms Raspect.   SIR's from either CAP vs Influenza, await cultures. Empiric Levaquin and Tamiflu for now. Wait for fever to defervesce. Rest as above  Nena Alexander MD [facility].

## 2014-04-11 DIAGNOSIS — J1100 Influenza due to unidentified influenza virus with unspecified type of pneumonia: Secondary | ICD-10-CM

## 2014-04-11 DIAGNOSIS — J101 Influenza due to other identified influenza virus with other respiratory manifestations: Secondary | ICD-10-CM

## 2014-04-11 DIAGNOSIS — J45901 Unspecified asthma with (acute) exacerbation: Secondary | ICD-10-CM

## 2014-04-11 LAB — COMPREHENSIVE METABOLIC PANEL
ALT: 59 U/L — ABNORMAL HIGH (ref 0–35)
AST: 42 U/L — ABNORMAL HIGH (ref 0–37)
Albumin: 3.3 g/dL — ABNORMAL LOW (ref 3.5–5.2)
Alkaline Phosphatase: 116 U/L (ref 39–117)
Anion gap: 5 (ref 5–15)
BUN: <5 mg/dL — ABNORMAL LOW (ref 6–23)
CALCIUM: 8.1 mg/dL — AB (ref 8.4–10.5)
CO2: 27 mmol/L (ref 19–32)
CREATININE: 0.52 mg/dL (ref 0.50–1.10)
Chloride: 103 mmol/L (ref 96–112)
Glucose, Bld: 95 mg/dL (ref 70–99)
Potassium: 3.6 mmol/L (ref 3.5–5.1)
Sodium: 135 mmol/L (ref 135–145)
Total Bilirubin: 0.6 mg/dL (ref 0.3–1.2)
Total Protein: 7 g/dL (ref 6.0–8.3)

## 2014-04-11 LAB — URINE CULTURE: Colony Count: 50000

## 2014-04-11 LAB — CBC
HCT: 41.6 % (ref 36.0–46.0)
HEMOGLOBIN: 13.5 g/dL (ref 12.0–15.0)
MCH: 29.3 pg (ref 26.0–34.0)
MCHC: 32.5 g/dL (ref 30.0–36.0)
MCV: 90.2 fL (ref 78.0–100.0)
Platelets: 199 10*3/uL (ref 150–400)
RBC: 4.61 MIL/uL (ref 3.87–5.11)
RDW: 14.4 % (ref 11.5–15.5)
WBC: 5 10*3/uL (ref 4.0–10.5)

## 2014-04-11 MED ORDER — PREDNISONE 10 MG PO TABS
30.0000 mg | ORAL_TABLET | Freq: Every day | ORAL | Status: DC
  Administered 2014-04-12: 30 mg via ORAL
  Filled 2014-04-11 (×2): qty 1

## 2014-04-11 NOTE — Progress Notes (Signed)
Pt does not have PCP, conveyed message to [facility], unable to book PCP appointment at this time.

## 2014-04-11 NOTE — Progress Notes (Signed)
PROGRESS NOTE  Andrea Villegas NWG:[id number] DOB: [birth year] DOA: 04/09/2014 PCP: No PCP Per Patient  HPI/Subjective: Andrea Villegas is a 54 year old female with no significant past medical history who presented to the [facility] ED on 04/09/14 with 3-4 day history of fever, generalized weakness, nonproductive cough, and dizziness. Prior to admission the patient's symptoms worsened and she began experiencing SOB with wheezing and global myalgias.   In the ED, patient met SIRS criteria with fever of 102.6 and tachycardia at 116. Patient's O2 saturation dropped to 92% which prompted more extensive work up. CXR showed no acute changes. CT of abdomen showed hepatomegaly and steatosis as well as ovarian cyst. CT of chest showed diffuse nodules in the lungs. Lactic acid was 2.20. LFT's were elevated at AST 66 and ALT 84. Blood and urine cultures were collected. Patient was started on Tamiflu and Duo-Neb.   Today, Andrea Villegas is feeling better. She is still symptomatic with cough, fever, and myalgias but symptoms have improved since yesterday. Fever has improved this morning with a spike of 100.43F. She is no longer tachycardic at 86bpm. Influenza panel was positive for Influenza A and H1N1 detected. Blood cultures have shown no growth thus far and respiratory virus panel is still pending. Patient reported one episode of loose stool this morning which could be related to antibiotics but no other new complaints.   Assessment/Plan:    Acute respiratory failure with hypoxia: Likely secondary to Influenza A. Continue Tamiflu and supportive care.    ?VHQ:IONGEXBM lung nodules, empirically started on Levaquin-will plan a 5 day course-stop date 2/29. Will need repeat CT Chest in 4- 6 weeks to ensure resolution of these lung nodules   Elevated LFTs: Decreasing and today AST 42, ALT 59. Hepatitis panel negative. Monitor with daily CMP.    SIRS (systemic inflammatory response syndrome): improving with IVF,  Tamiflu and Levaquin   Ovarian cyst: 5.3 cm cyst associated with the right adnexa,will need close follow-up as outpatient.   Thickened distal esophagus :EGD as outpatient.On PPI  DVT Prophylaxis:  SCDs  Code Status: Full Family Communication: Husband was at bedside  Disposition Plan: Home when symptoms improve  Consultants:  None  Procedures:  None   Antibiotics: Anti-infectives    Start     Dose/Rate Route Frequency Ordered Stop   04/10/14 1000  levofloxacin (LEVAQUIN) IVPB 750 mg     750 mg 100 mL/hr over 90 Minutes Intravenous Every 24 hours 04/10/14 0925     04/09/14 2330  oseltamivir (TAMIFLU) capsule 75 mg     75 mg Oral 2 times daily 04/09/14 2241     04/09/14 1400  oseltamivir (TAMIFLU) capsule 75 mg     75 mg Oral  Once 04/09/14 1348 04/09/14 1422   04/09/14 0000  oseltamivir (TAMIFLU) 75 MG capsule     75 mg Oral Every 12 hours 04/09/14 1925        Objective: Filed Vitals:   04/10/14 2116 04/11/14 0130 04/11/14 0610 04/11/14 0637  BP: 111/68  121/70   Pulse: 102  86   Temp: 102.5 F (39.2 C) 98.7 F (37.1 C) 98.9 F (37.2 C) 100.7 F (38.2 C)  TempSrc: Oral Oral Axillary Oral  Resp: 18  12   Height:      Weight:      SpO2: 90%  93%     Intake/Output Summary (Last 24 hours) at 04/11/14 1153 Last data filed at 04/11/14 1007  Gross per 24 hour  Intake  855 ml  Output    500 ml  Net    355 ml   Filed Weights   04/10/14 0500  Weight: 77.2 kg (170 lb 3.1 oz)    Exam: General: Well developed, well nourished, NAD, appears stated age  74:  PERR, Anicteic Sclera, MMM. No pharyngeal erythema or exudates  Neck: Supple, no JVD, no masses Cardiovascular: RRR, S1 S2 auscultated, no rubs, murmurs or gallops.   Respiratory: Clear to auscultation bilaterally with equal chest rise  Abdomen: Soft, nontender, nondistended, + bowel sounds  Extremities: warm dry without cyanosis clubbing or edema.  Neuro: AAOx3, cranial nerves grossly intact. Strength  5/5 in upper and lower extremities  Skin: Without rashes exudates or nodules.   Psych: Normal affect and demeanor with intact judgement and insight   Data Reviewed: Basic Metabolic Panel:  Recent Labs Lab 04/09/14 1108 04/10/14 0549 04/11/14 0540  NA 137 134* 135  K 3.5 3.9 3.6  CL 99 99 103  CO2 _0 GLUCOSE 145* 112* 95  BUN 8 6 <5*  CREATININE 0.73 0.61 0.52  CALCIUM 9.2 8.3* 8.1*   Liver Function Tests:  Recent Labs Lab 04/09/14 1108 04/10/14 0549 04/11/14 0540  AST 57* 66* 42*  ALT 84* 84* 59*  ALKPHOS 133* 140* 116  BILITOT 0.4 0.5 0.6  PROT 8.6* 7.1 7.0  ALBUMIN 4.4 3.4* 3.3*    Recent Labs Lab 04/09/14 1108  LIPASE 42   CBC:  Recent Labs Lab 04/09/14 1108 04/10/14 0549 04/11/14 0540  WBC 6.0 5.9 5.0  NEUTROABS 3.8 3.5  --   HGB 15.3* 13.1 13.5  HCT 46.2* 40.1 41.6  MCV 90.4 90.5 90.2  PLT 244 203 199   Cardiac Enzymes:  Recent Labs Lab 04/09/14 0006  TROPONINI <0.03   BNP (last 3 results)  Recent Labs  04/09/14 0006  BNP 113.6*    Recent Results (from the past 240 hour(s))  Urine culture     Status: None   Collection Time: 04/09/14  1:07 PM  Result Value Ref Range Status   Specimen Description URINE, RANDOM  Final   Special Requests ADDED [id number]  Final   Colony Count   Final    50,000 COLONIES/ML Performed at [facility]    Culture   Final    Multiple bacterial morphotypes present, none predominant. Suggest appropriate recollection if clinically indicated. Performed at [facility]    Report Status 04/11/2014 FINAL  Final  Culture, blood (routine x 2)     Status: None (Preliminary result)   Collection Time: 04/09/14  9:05 PM  Result Value Ref Range Status   Specimen Description BLOOD ARM RIGHT  Final   Special Requests BOTTLES DRAWN AEROBIC AND ANAEROBIC 10CC  Final   Culture   Final           BLOOD CULTURE RECEIVED NO GROWTH TO DATE CULTURE WILL BE HELD FOR 5 DAYS BEFORE ISSUING A FINAL  NEGATIVE REPORT Performed at [facility]    Report Status PENDING  Incomplete  Culture, blood (routine x 2)     Status: None (Preliminary result)   Collection Time: 04/09/14  9:15 PM  Result Value Ref Range Status   Specimen Description BLOOD HAND RIGHT  Final   Special Requests BOTTLES DRAWN AEROBIC ONLY 8CC  Final   Culture   Final           BLOOD CULTURE RECEIVED NO GROWTH TO DATE CULTURE WILL BE HELD FOR  5 DAYS BEFORE ISSUING A FINAL NEGATIVE REPORT Performed at [facility]    Report Status PENDING  Incomplete     Studies: Dg Chest 2 View  04/09/2014   CLINICAL DATA:  Dry cough for 4 days with shortness of Breath  EXAM: CHEST  2 VIEW  COMPARISON:  12/27/2013  FINDINGS: Cardiac shadow is within normal limits. The lungs are well aerated bilaterally. No focal infiltrate or sizable effusion is seen. No acute bony abnormality is noted.  IMPRESSION: No acute abnormality seen.   Electronically Signed   By: Inez Catalina M.D.   On: 04/09/2014 12:45   Ct Angio Chest Pe W/cm &/or Wo Cm  04/09/2014   CLINICAL DATA:  54 year old female with a history of generalized weakness, dizziness, fever. Elevated LFTs.  EXAM: CT CHEST, ABDOMEN, AND PELVIS WITH CONTRAST  TECHNIQUE: Multidetector CT imaging of the chest, abdomen and pelvis was performed following the standard protocol during bolus administration of intravenous contrast.  CONTRAST:  171m OMNIPAQUE IOHEXOL 350 MG/ML SOLN  COMPARISON:  None.  FINDINGS: CT CHEST FINDINGS  Unremarkable appearance of the superficial soft tissues of the chest.  Unremarkable appearance of the thoracic inlet and thyroid gland.  Axillary lymph nodes are present, though are not enlarged by CT size criteria. No supraclavicular adenopathy.  Central airways are patent with no debris. There are a few more peripheral airways with debris within the lumen (such as left lower lobe image 87 of series 402).  The arm lymph nodes within the mediastinum, not enlarged by CT  size criteria.  Heart size within normal limits with no pericardial fluid/ thickening. No calcifications of the coronary vasculature.  There is circumferential thickening of the distal esophagus. Several lymph nodes are present in the periesophageal stations at the esophageal hiatus. Largest measures approximately 6 mm to 7 mm on image 80 of series 401.  Wedge-shaped ground-glass/reticular opacity at the periphery of the right upper lobe, abutting the minor fissure. Geographic ground-glass throughout the lungs, with some regions of hyper aerated lung.  Several ill-defined nodules are present throughout the lungs, including the largest in the left upper lobe on image 50 of series 402 measuring 5 mm. Majority of the nodules are at the periphery of the lungs, with a centrilobular distribution.  A few of the distal airways demonstrate debris within the airways.  No confluent airspace disease, pleural effusion, or pneumothorax.  Unremarkable appearance of the thoracic aorta without aneurysm, dissection flap, periaortic fluid. No significant atherosclerotic changes. Branch vessels are patent, with 3 vessel arch.  Unremarkable appearance of the main pulmonary artery, with no central, lobar, segmental, or subsegmental filling defects.  The distal pulmonary arteries are slightly more prominent than expected, greater than the size of the match to bronchi in the outer third of the lungs.  CT ABDOMEN AND PELVIS FINDINGS  Diffusely decreased attenuation/ enhancement of liver parenchyma with a cranial caudal span measuring 21 cm. Focal fatty sparing adjacent to the falciform ligament. Unremarkable appearance of spleen.  Unremarkable appearance of the bilateral adrenal glands.  Surgical changes of prior cholecystectomy.  No peripancreatic fluid or inflammatory changes.  No intrahepatic or extrahepatic biliary ductal dilatation.  No evidence of left or right hydronephrosis. No evidence of nephrolithiasis.  There are bilateral sub  cm rounded hypodensities within the renal cortex, too small to completely characterize. No perinephric stranding.  No abnormally distended small bowel or colon. Enteric contrast reaches the distal colon  The terminal ileum is adjacent is segment 6, with  the cecum looped into the liver hilum. The appendix is not identified, though there are no inflammatory changes adjacent to the cecum.  Multiple colonic diverticula in the sigmoid colon without associated inflammatory changes.  No free air or significant free fluid.  Several mediastinal lymph nodes adjacent to the esophagus, as noted above. Lymph nodes within the small bowel mesentery and preaortic/ para-aortic nodal stations, none of which are enlarged by CT size criteria.  Unremarkable appearance of the abdominal aorta without aneurysm dissection flap or periaortic fluid. Minimal atherosclerotic changes are present.  Mesenteric vessels are patent.  Evidence of fibroids involving the uterus.  Trace endometrial fluid.  Low-density cystic lesion associated with the right adnexa measuring 5.3 cm. Unremarkable appearance of the left adnexa.  No displaced fracture. Mild degenerative changes of the lower lumbar spine.  IMPRESSION: Chest:  Multiple peripheral ground-glass/confluent nodules measuring no greater than 5 mm throughout the lungs in a distribution which, in the absence of known malignancy, is favored to represent inflammatory process such as bronchitis/bronchiolitis. Other sources of infection should be included on the differential including septic emboli. Follow-up imaging once the patient has cleared this acute process would be useful to assure resolution.  Mild bronchial wall thickening with a few small airways containing debris, supporting the diagnosis of bronchitis/bronchiolitis.  Circumferential thickening of the distal esophagus, with adjacent borderline enlarged lymph nodes. Correlation with a history of GERD and potentially upper endoscopy is  recommended.  Abdomen:  No acute abdominal process.  5.3 cm cyst associated with the right adnexa. SRU consensus guidelines suggest following a cyst of this size with annual ultrasound whether premenopausal or postmenopausal. Initiation into surveillance is recommended.  Hepatomegaly and steatosis.  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  Signed,  Dulcy Fanny. Earleen Newport, DO  [facility]  [facility]   Electronically Signed   By: Corrie Mckusick D.O.   On: 04/09/2014 18:59   Ct Abdomen Pelvis W Contrast  04/09/2014   CLINICAL DATA:  54 year old female with a history of generalized weakness, dizziness, fever. Elevated LFTs.  EXAM: CT CHEST, ABDOMEN, AND PELVIS WITH CONTRAST  TECHNIQUE: Multidetector CT imaging of the chest, abdomen and pelvis was performed following the standard protocol during bolus administration of intravenous contrast.  CONTRAST:  180m OMNIPAQUE IOHEXOL 350 MG/ML SOLN  COMPARISON:  None.  FINDINGS: CT CHEST FINDINGS  Unremarkable appearance of the superficial soft tissues of the chest.  Unremarkable appearance of the thoracic inlet and thyroid gland.  Axillary lymph nodes are present, though are not enlarged by CT size criteria. No supraclavicular adenopathy.  Central airways are patent with no debris. There are a few more peripheral airways with debris within the lumen (such as left lower lobe image 87 of series 402).  The arm lymph nodes within the mediastinum, not enlarged by CT size criteria.  Heart size within normal limits with no pericardial fluid/ thickening. No calcifications of the coronary vasculature.  There is circumferential thickening of the distal esophagus. Several lymph nodes are present in the periesophageal stations at the esophageal hiatus. Largest measures approximately 6 mm to 7 mm on image 80 of series 401.  Wedge-shaped ground-glass/reticular opacity at the periphery of the right upper lobe, abutting  the minor fissure. Geographic ground-glass throughout the lungs, with some regions of hyper aerated lung.  Several ill-defined nodules are present throughout the lungs, including the largest in the left upper lobe on image 50 of series 402 measuring 5 mm. Majority of the nodules  are at the periphery of the lungs, with a centrilobular distribution.  A few of the distal airways demonstrate debris within the airways.  No confluent airspace disease, pleural effusion, or pneumothorax.  Unremarkable appearance of the thoracic aorta without aneurysm, dissection flap, periaortic fluid. No significant atherosclerotic changes. Branch vessels are patent, with 3 vessel arch.  Unremarkable appearance of the main pulmonary artery, with no central, lobar, segmental, or subsegmental filling defects.  The distal pulmonary arteries are slightly more prominent than expected, greater than the size of the match to bronchi in the outer third of the lungs.  CT ABDOMEN AND PELVIS FINDINGS  Diffusely decreased attenuation/ enhancement of liver parenchyma with a cranial caudal span measuring 21 cm. Focal fatty sparing adjacent to the falciform ligament. Unremarkable appearance of spleen.  Unremarkable appearance of the bilateral adrenal glands.  Surgical changes of prior cholecystectomy.  No peripancreatic fluid or inflammatory changes.  No intrahepatic or extrahepatic biliary ductal dilatation.  No evidence of left or right hydronephrosis. No evidence of nephrolithiasis.  There are bilateral sub cm rounded hypodensities within the renal cortex, too small to completely characterize. No perinephric stranding.  No abnormally distended small bowel or colon. Enteric contrast reaches the distal colon  The terminal ileum is adjacent is segment 6, with the cecum looped into the liver hilum. The appendix is not identified, though there are no inflammatory changes adjacent to the cecum.  Multiple colonic diverticula in the sigmoid colon without  associated inflammatory changes.  No free air or significant free fluid.  Several mediastinal lymph nodes adjacent to the esophagus, as noted above. Lymph nodes within the small bowel mesentery and preaortic/ para-aortic nodal stations, none of which are enlarged by CT size criteria.  Unremarkable appearance of the abdominal aorta without aneurysm dissection flap or periaortic fluid. Minimal atherosclerotic changes are present.  Mesenteric vessels are patent.  Evidence of fibroids involving the uterus.  Trace endometrial fluid.  Low-density cystic lesion associated with the right adnexa measuring 5.3 cm. Unremarkable appearance of the left adnexa.  No displaced fracture. Mild degenerative changes of the lower lumbar spine.  IMPRESSION: Chest:  Multiple peripheral ground-glass/confluent nodules measuring no greater than 5 mm throughout the lungs in a distribution which, in the absence of known malignancy, is favored to represent inflammatory process such as bronchitis/bronchiolitis. Other sources of infection should be included on the differential including septic emboli. Follow-up imaging once the patient has cleared this acute process would be useful to assure resolution.  Mild bronchial wall thickening with a few small airways containing debris, supporting the diagnosis of bronchitis/bronchiolitis.  Circumferential thickening of the distal esophagus, with adjacent borderline enlarged lymph nodes. Correlation with a history of GERD and potentially upper endoscopy is recommended.  Abdomen:  No acute abdominal process.  5.3 cm cyst associated with the right adnexa. SRU consensus guidelines suggest following a cyst of this size with annual ultrasound whether premenopausal or postmenopausal. Initiation into surveillance is recommended.  Hepatomegaly and steatosis.  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  Signed,  Dulcy Fanny. Earleen Newport, DO  [facility]   [facility]   Electronically Signed   By: Corrie Mckusick D.O.   On: 04/09/2014 18:59    Scheduled Meds: . levofloxacin (LEVAQUIN) IV  750 mg Intravenous Q24H  . oseltamivir  75 mg Oral BID  . pantoprazole  40 mg Oral Q1200   Continuous Infusions:   Principal Problem:   Acute respiratory failure with hypoxia Active Problems:  Elevated LFTs   SIRS (systemic inflammatory response syndrome)   Shortness of breath   Pyrexia   Viral illness  Raspect, Erin, PA-S [facility] 04/11/2014, 11:53 AM   Attending Patient was seen, examined,treatment plan was discussed with the  Advance Practice Provider.  I have directly reviewed the clinical findings, lab, imaging studies and management of this patient in detail. I have made the necessary changes to the above noted documentation, and agree with the documentation, as recorded by the Advance Practice Provider.   Improving. Has influenza with numerous lung nodules-empirically on Tamiflu and Levaquin. Suspect if clinically improvement continues, she could be discharged on 2/27.  Nena Alexander MD [facility].

## 2014-04-11 NOTE — Progress Notes (Signed)
PCCM PROGRESS NOTE  SUBJECTIVE: She still has cough, and wheeze.  OBJECTIVE: Temp:  [98.7 F (37.1 C)-103 F (39.4 C)] 100.7 F (38.2 C) (02/26 0637) Pulse Rate:  [86-104] 86 (02/26 0610) Resp:  [12-18] 12 (02/26 0610) BP: (109-121)/(68-70) 121/70 mmHg (02/26 0610) SpO2:  [90 %-93 %] 93 % (02/26 0610)  General: no distress HEENT: no sinus tenderness Cardiac: regular Chest: b/l expiratory wheeze Abd: soft, non tender Ext: no edema Neuro: normal strength Skin: no rash  CBC Recent Labs     04/09/14  1108  04/10/14  0549  04/11/14  0540  WBC  6.0  5.9  5.0  HGB  15.3*  13.1  13.5  HCT  46.2*  40.1  41.6  PLT  244  203  199   BMET Recent Labs     04/09/14  1108  04/10/14  0549  04/11/14  0540  NA  137  134*  135  K  3.5  3.9  3.6  CL  99  99  103  CO2  29  23  27   BUN  8  6  <5*  CREATININE  0.73  0.61  0.52  GLUCOSE  145*  112*  95    Electrolytes Recent Labs     04/09/14  1108  04/10/14  0549  04/11/14  0540  CALCIUM  9.2  8.3*  8.1*   Liver Enzymes Recent Labs     04/09/14  1108  04/10/14  0549  04/11/14  0540  AST  57*  66*  42*  ALT  84*  84*  59*  ALKPHOS  133*  140*  116  BILITOT  0.4  0.5  0.6  ALBUMIN  4.4  3.4*  3.3*    Cardiac Enzymes Recent Labs     04/09/14  0006  TROPONINI  <0.03   Imaging Ct Angio Chest Pe W/cm &/or Wo Cm  04/09/2014   CLINICAL DATA:  54 year old female with a history of generalized weakness, dizziness, fever. Elevated LFTs.  EXAM: CT CHEST, ABDOMEN, AND PELVIS WITH CONTRAST  TECHNIQUE: Multidetector CT imaging of the chest, abdomen and pelvis was performed following the standard protocol during bolus administration of intravenous contrast.  CONTRAST:  OMNIPAQUE IOHEXOL 350 MG/ML SOLN  COMPARISON:  None.  FINDINGS: CT CHEST FINDINGS  Unremarkable appearance of the superficial soft tissues of the chest.  Unremarkable appearance of the thoracic inlet and thyroid gland.  Axillary lymph nodes are present,  though are not enlarged by CT size criteria. No supraclavicular adenopathy.  Central airways are patent with no debris. There are a few more peripheral airways with debris within the lumen (such as left lower lobe image 87 of series 402).  The arm lymph nodes within the mediastinum, not enlarged by CT size criteria.  Heart size within normal limits with no pericardial fluid/ thickening. No calcifications of the coronary vasculature.  There is circumferential thickening of the distal esophagus. Several lymph nodes are present in the periesophageal stations at the esophageal hiatus. Largest measures approximately 6 mm to 7 mm on image 80 of series 401.  Wedge-shaped ground-glass/reticular opacity at the periphery of the right upper lobe, abutting the minor fissure. Geographic ground-glass throughout the lungs, with some regions of hyper aerated lung.  Several ill-defined nodules are present throughout the lungs, including the largest in the left upper lobe on image 50 of series 402 measuring 5 mm. Majority of the nodules are at the periphery of the lungs, with  a centrilobular distribution.  A few of the distal airways demonstrate debris within the airways.  No confluent airspace disease, pleural effusion, or pneumothorax.  Unremarkable appearance of the thoracic aorta without aneurysm, dissection flap, periaortic fluid. No significant atherosclerotic changes. Branch vessels are patent, with 3 vessel arch.  Unremarkable appearance of the main pulmonary artery, with no central, lobar, segmental, or subsegmental filling defects.  The distal pulmonary arteries are slightly more prominent than expected, greater than the size of the match to bronchi in the outer third of the lungs.  CT ABDOMEN AND PELVIS FINDINGS  Diffusely decreased attenuation/ enhancement of liver parenchyma with a cranial caudal span measuring 21 cm. Focal fatty sparing adjacent to the falciform ligament. Unremarkable appearance of spleen.  Unremarkable  appearance of the bilateral adrenal glands.  Surgical changes of prior cholecystectomy.  No peripancreatic fluid or inflammatory changes.  No intrahepatic or extrahepatic biliary ductal dilatation.  No evidence of left or right hydronephrosis. No evidence of nephrolithiasis.  There are bilateral sub cm rounded hypodensities within the renal cortex, too small to completely characterize. No perinephric stranding.  No abnormally distended small bowel or colon. Enteric contrast reaches the distal colon  The terminal ileum is adjacent is segment 6, with the cecum looped into the liver hilum. The appendix is not identified, though there are no inflammatory changes adjacent to the cecum.  Multiple colonic diverticula in the sigmoid colon without associated inflammatory changes.  No free air or significant free fluid.  Several mediastinal lymph nodes adjacent to the esophagus, as noted above. Lymph nodes within the small bowel mesentery and preaortic/ para-aortic nodal stations, none of which are enlarged by CT size criteria.  Unremarkable appearance of the abdominal aorta without aneurysm dissection flap or periaortic fluid. Minimal atherosclerotic changes are present.  Mesenteric vessels are patent.  Evidence of fibroids involving the uterus.  Trace endometrial fluid.  Low-density cystic lesion associated with the right adnexa measuring 5.3 cm. Unremarkable appearance of the left adnexa.  No displaced fracture. Mild degenerative changes of the lower lumbar spine.  IMPRESSION: Chest:  Multiple peripheral ground-glass/confluent nodules measuring no greater than 5 mm throughout the lungs in a distribution which, in the absence of known malignancy, is favored to represent inflammatory process such as bronchitis/bronchiolitis. Other sources of infection should be included on the differential including septic emboli. Follow-up imaging once the patient has cleared this acute process would be useful to assure resolution.  Mild  bronchial wall thickening with a few small airways containing debris, supporting the diagnosis of bronchitis/bronchiolitis.  Circumferential thickening of the distal esophagus, with adjacent borderline enlarged lymph nodes. Correlation with a history of GERD and potentially upper endoscopy is recommended.  Abdomen:  No acute abdominal process.  5.3 cm cyst associated with the right adnexa. SRU consensus guidelines suggest following a cyst of this size with annual ultrasound whether premenopausal or postmenopausal. Initiation into surveillance is recommended.  Hepatomegaly and steatosis.  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  Signed,  Yvone Neu. Loreta Ave, DO  [facility]  [facility]   Electronically Signed   By: Gilmer Mor D.O.   On: 04/09/2014 18:59   Ct Abdomen Pelvis W Contrast  04/09/2014   CLINICAL DATA:  54 year old female with a history of generalized weakness, dizziness, fever. Elevated LFTs.  EXAM: CT CHEST, ABDOMEN, AND PELVIS WITH CONTRAST  TECHNIQUE: Multidetector CT imaging of the chest, abdomen and pelvis was performed following the standard protocol during bolus administration of intravenous  contrast.  CONTRAST:  OMNIPAQUE IOHEXOL 350 MG/ML SOLN  COMPARISON:  None.  FINDINGS: CT CHEST FINDINGS  Unremarkable appearance of the superficial soft tissues of the chest.  Unremarkable appearance of the thoracic inlet and thyroid gland.  Axillary lymph nodes are present, though are not enlarged by CT size criteria. No supraclavicular adenopathy.  Central airways are patent with no debris. There are a few more peripheral airways with debris within the lumen (such as left lower lobe image 87 of series 402).  The arm lymph nodes within the mediastinum, not enlarged by CT size criteria.  Heart size within normal limits with no pericardial fluid/ thickening. No calcifications of the coronary vasculature.  There is circumferential  thickening of the distal esophagus. Several lymph nodes are present in the periesophageal stations at the esophageal hiatus. Largest measures approximately 6 mm to 7 mm on image 80 of series 401.  Wedge-shaped ground-glass/reticular opacity at the periphery of the right upper lobe, abutting the minor fissure. Geographic ground-glass throughout the lungs, with some regions of hyper aerated lung.  Several ill-defined nodules are present throughout the lungs, including the largest in the left upper lobe on image 50 of series 402 measuring 5 mm. Majority of the nodules are at the periphery of the lungs, with a centrilobular distribution.  A few of the distal airways demonstrate debris within the airways.  No confluent airspace disease, pleural effusion, or pneumothorax.  Unremarkable appearance of the thoracic aorta without aneurysm, dissection flap, periaortic fluid. No significant atherosclerotic changes. Branch vessels are patent, with 3 vessel arch.  Unremarkable appearance of the main pulmonary artery, with no central, lobar, segmental, or subsegmental filling defects.  The distal pulmonary arteries are slightly more prominent than expected, greater than the size of the match to bronchi in the outer third of the lungs.  CT ABDOMEN AND PELVIS FINDINGS  Diffusely decreased attenuation/ enhancement of liver parenchyma with a cranial caudal span measuring 21 cm. Focal fatty sparing adjacent to the falciform ligament. Unremarkable appearance of spleen.  Unremarkable appearance of the bilateral adrenal glands.  Surgical changes of prior cholecystectomy.  No peripancreatic fluid or inflammatory changes.  No intrahepatic or extrahepatic biliary ductal dilatation.  No evidence of left or right hydronephrosis. No evidence of nephrolithiasis.  There are bilateral sub cm rounded hypodensities within the renal cortex, too small to completely characterize. No perinephric stranding.  No abnormally distended small bowel or colon.  Enteric contrast reaches the distal colon  The terminal ileum is adjacent is segment 6, with the cecum looped into the liver hilum. The appendix is not identified, though there are no inflammatory changes adjacent to the cecum.  Multiple colonic diverticula in the sigmoid colon without associated inflammatory changes.  No free air or significant free fluid.  Several mediastinal lymph nodes adjacent to the esophagus, as noted above. Lymph nodes within the small bowel mesentery and preaortic/ para-aortic nodal stations, none of which are enlarged by CT size criteria.  Unremarkable appearance of the abdominal aorta without aneurysm dissection flap or periaortic fluid. Minimal atherosclerotic changes are present.  Mesenteric vessels are patent.  Evidence of fibroids involving the uterus.  Trace endometrial fluid.  Low-density cystic lesion associated with the right adnexa measuring 5.3 cm. Unremarkable appearance of the left adnexa.  No displaced fracture. Mild degenerative changes of the lower lumbar spine.  IMPRESSION: Chest:  Multiple peripheral ground-glass/confluent nodules measuring no greater than 5 mm throughout the lungs in a distribution which, in the absence of known  malignancy, is favored to represent inflammatory process such as bronchitis/bronchiolitis. Other sources of infection should be included on the differential including septic emboli. Follow-up imaging once the patient has cleared this acute process would be useful to assure resolution.  Mild bronchial wall thickening with a few small airways containing debris, supporting the diagnosis of bronchitis/bronchiolitis.  Circumferential thickening of the distal esophagus, with adjacent borderline enlarged lymph nodes. Correlation with a history of GERD and potentially upper endoscopy is recommended.  Abdomen:  No acute abdominal process.  5.3 cm cyst associated with the right adnexa. SRU consensus guidelines suggest following a cyst of this size with  annual ultrasound whether premenopausal or postmenopausal. Initiation into surveillance is recommended.  Hepatomegaly and steatosis.  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  Signed,  Yvone NeuJaime S. Loreta AveWagner, DO  [facility]  [facility]   Electronically Signed   By: Gilmer MorJaime  Wagner D.O.   On: 04/09/2014 18:59    ASSESSMENT/PLAN:  Acute hypoxic respiratory failure and asthmatic bronchitis 2nd to Influenza PNA. Plan: - Day 3 of tamiflu, Day 2 of levaquin - add prednisone 30 mg daily on 2/26 >> wean off as tolerated over next 5 days - xopenex - bronchial hygiene - she will need f/u CXR as outpt - she will need to be set up with PCP as outpt >. Defer to primary team for arrangements  Updated family at bedside.  PCCM will sign off.  Please call if additional help needed while pt is in hospital.  Coralyn HellingVineet Muskan Bolla, MD [facility] 04/11/2014, 12:40 PM Pager:  [phone] After 3pm call: [phone]

## 2014-04-12 MED ORDER — PANTOPRAZOLE SODIUM 40 MG PO TBEC: 40.0000 mg | DELAYED_RELEASE_TABLET | Freq: Every day | ORAL | Status: DC

## 2014-04-12 MED ORDER — ALBUTEROL SULFATE HFA 108 (90 BASE) MCG/ACT IN AERS: 2.0000 | INHALATION_SPRAY | RESPIRATORY_TRACT | Status: DC | PRN

## 2014-04-12 MED ORDER — HYDROCODONE-ACETAMINOPHEN 5-325 MG PO TABS: 1.0000 | ORAL_TABLET | Freq: Four times a day (QID) | ORAL | Status: DC | PRN

## 2014-04-12 MED ORDER — PREDNISONE 10 MG PO TABS: ORAL_TABLET | ORAL | Status: DC

## 2014-04-12 MED ORDER — LEVOFLOXACIN 500 MG PO TABS: 500.0000 mg | ORAL_TABLET | Freq: Every day | ORAL | Status: DC

## 2014-04-12 MED ORDER — GUAIFENESIN 100 MG/5ML PO SYRP: 200.0000 mg | ORAL_SOLUTION | ORAL | Status: DC | PRN

## 2014-04-12 MED ORDER — OSELTAMIVIR PHOSPHATE 75 MG PO CAPS: 75.0000 mg | ORAL_CAPSULE | Freq: Two times a day (BID) | ORAL | Status: DC

## 2014-04-12 MED ORDER — GUAIFENESIN 100 MG/5ML PO SYRP
200.0000 mg | ORAL_SOLUTION | ORAL | Status: DC | PRN
  Administered 2014-04-12: 200 mg via ORAL
  Filled 2014-04-12 (×2): qty 10

## 2014-04-12 MED ORDER — BENZONATATE 200 MG PO CAPS: 200.0000 mg | ORAL_CAPSULE | Freq: Three times a day (TID) | ORAL | Status: DC | PRN

## 2014-04-12 MED ORDER — BENZONATATE 100 MG PO CAPS
200.0000 mg | ORAL_CAPSULE | Freq: Three times a day (TID) | ORAL | Status: DC | PRN
  Administered 2014-04-12: 200 mg via ORAL
  Filled 2014-04-12 (×2): qty 2

## 2014-04-12 NOTE — Discharge Summary (Signed)
PATIENT DETAILS Name: Andrea Villegas Age: 54 y.o. Sex: female Date of Birth: 12/18/1960 MRN: [id number]. Admitting Physician: Eduard Clos, MD PCP:No PCP Per Patient  Admit Date: 04/09/2014 Discharge date: 04/12/2014  Recommendations for Outpatient Follow-up:  1. Repeat CXR/CT Chest in 4-6 weeks to document resoluntion of lung nodules 2. Refer to GI and GYN for evaluation of thickened esophagus see on CT Chest and Adnexal cyst seen on CT Abd 3. Follow LFT's  PRIMARY DISCHARGE DIAGNOSIS:  Principal Problem:   Acute respiratory failure with hypoxia Active Problems:   Elevated LFTs   SIRS (systemic inflammatory response syndrome)   Shortness of breath   Pyrexia   Viral illness   Influenza A      PAST MEDICAL HISTORY: Past Medical History  Diagnosis Date  . Medical history non-contributory     DISCHARGE MEDICATIONS: Current Discharge Medication List    START taking these medications   Details  albuterol (PROVENTIL HFA;VENTOLIN HFA) 108 (90 BASE) MCG/ACT inhaler Inhale 2 puffs into the lungs every 4 (four) hours as needed for wheezing or shortness of breath. Qty: 1 Inhaler, Refills: 0    benzonatate (TESSALON) 200 MG capsule Take 1 capsule (200 mg total) by mouth 3 (three) times daily as needed for cough. Qty: 20 capsule, Refills: 0    guaifenesin (ROBITUSSIN) 100 MG/5ML syrup Take 10 mLs (200 mg total) by mouth every 4 (four) hours as needed for congestion. Qty: 120 mL, Refills: 0    levofloxacin (LEVAQUIN) 500 MG tablet Take 1 tablet (500 mg total) by mouth daily. Qty: 3 tablet, Refills: 0    oseltamivir (TAMIFLU) 75 MG capsule Take 1 capsule (75 mg total) by mouth 2 (two) times daily. Qty: 3 capsule, Refills: 0    predniSONE (DELTASONE) 10 MG tablet Take 3 tablets (30 mg) daily for 2 days, then, Take 2 tablets (20 mg) daily for 2 days, then, Take 1 tablets (10 mg) daily for 1 days, then stop Qty: 11 tablet, Refills: 0      CONTINUE these medications  which have CHANGED   Details  HYDROcodone-acetaminophen (NORCO/VICODIN) 5-325 MG per tablet Take 1 tablet by mouth every 6 (six) hours as needed. Qty: 15 tablet, Refills: 0      CONTINUE these medications which have NOT CHANGED   Details  Multiple Vitamin (MULTIVITAMIN WITH MINERALS) TABS Take 1 tablet by mouth daily.        ALLERGIES:  No Known Allergies  BRIEF HPI:  See H&P, Labs, Consult and Test reports for all details in brief, patient  is a 54 y.o. female with no significant past medical history presents to the ER because of fever and weakness and nonproductive cough with some wheezing  CONSULTATIONS:   pulmonary/intensive care  PERTINENT RADIOLOGIC STUDIES: Dg Chest 2 View  04/09/2014   CLINICAL DATA:  Dry cough for 4 days with shortness of Breath  EXAM: CHEST  2 VIEW  COMPARISON:  12/27/2013  FINDINGS: Cardiac shadow is within normal limits. The lungs are well aerated bilaterally. No focal infiltrate or sizable effusion is seen. No acute bony abnormality is noted.  IMPRESSION: No acute abnormality seen.   Electronically Signed   By: Alcide Clever M.D.   On: 04/09/2014 12:45   Ct Angio Chest Pe W/cm &/or Wo Cm  04/09/2014   CLINICAL DATA:  54 year old female with a history of generalized weakness, dizziness, fever. Elevated LFTs.  EXAM: CT CHEST, ABDOMEN, AND PELVIS WITH CONTRAST  TECHNIQUE: Multidetector CT imaging of the chest,  abdomen and pelvis was performed following the standard protocol during bolus administration of intravenous contrast.  CONTRAST:  OMNIPAQUE IOHEXOL 350 MG/ML SOLN  COMPARISON:  None.  FINDINGS: CT CHEST FINDINGS  Unremarkable appearance of the superficial soft tissues of the chest.  Unremarkable appearance of the thoracic inlet and thyroid gland.  Axillary lymph nodes are present, though are not enlarged by CT size criteria. No supraclavicular adenopathy.  Central airways are patent with no debris. There are a few more peripheral airways with debris  within the lumen (such as left lower lobe image 87 of series 402).  The arm lymph nodes within the mediastinum, not enlarged by CT size criteria.  Heart size within normal limits with no pericardial fluid/ thickening. No calcifications of the coronary vasculature.  There is circumferential thickening of the distal esophagus. Several lymph nodes are present in the periesophageal stations at the esophageal hiatus. Largest measures approximately 6 mm to 7 mm on image 80 of series 401.  Wedge-shaped ground-glass/reticular opacity at the periphery of the right upper lobe, abutting the minor fissure. Geographic ground-glass throughout the lungs, with some regions of hyper aerated lung.  Several ill-defined nodules are present throughout the lungs, including the largest in the left upper lobe on image 50 of series 402 measuring 5 mm. Majority of the nodules are at the periphery of the lungs, with a centrilobular distribution.  A few of the distal airways demonstrate debris within the airways.  No confluent airspace disease, pleural effusion, or pneumothorax.  Unremarkable appearance of the thoracic aorta without aneurysm, dissection flap, periaortic fluid. No significant atherosclerotic changes. Branch vessels are patent, with 3 vessel arch.  Unremarkable appearance of the main pulmonary artery, with no central, lobar, segmental, or subsegmental filling defects.  The distal pulmonary arteries are slightly more prominent than expected, greater than the size of the match to bronchi in the outer third of the lungs.  CT ABDOMEN AND PELVIS FINDINGS  Diffusely decreased attenuation/ enhancement of liver parenchyma with a cranial caudal span measuring 21 cm. Focal fatty sparing adjacent to the falciform ligament. Unremarkable appearance of spleen.  Unremarkable appearance of the bilateral adrenal glands.  Surgical changes of prior cholecystectomy.  No peripancreatic fluid or inflammatory changes.  No intrahepatic or extrahepatic  biliary ductal dilatation.  No evidence of left or right hydronephrosis. No evidence of nephrolithiasis.  There are bilateral sub cm rounded hypodensities within the renal cortex, too small to completely characterize. No perinephric stranding.  No abnormally distended small bowel or colon. Enteric contrast reaches the distal colon  The terminal ileum is adjacent is segment 6, with the cecum looped into the liver hilum. The appendix is not identified, though there are no inflammatory changes adjacent to the cecum.  Multiple colonic diverticula in the sigmoid colon without associated inflammatory changes.  No free air or significant free fluid.  Several mediastinal lymph nodes adjacent to the esophagus, as noted above. Lymph nodes within the small bowel mesentery and preaortic/ para-aortic nodal stations, none of which are enlarged by CT size criteria.  Unremarkable appearance of the abdominal aorta without aneurysm dissection flap or periaortic fluid. Minimal atherosclerotic changes are present.  Mesenteric vessels are patent.  Evidence of fibroids involving the uterus.  Trace endometrial fluid.  Low-density cystic lesion associated with the right adnexa measuring 5.3 cm. Unremarkable appearance of the left adnexa.  No displaced fracture. Mild degenerative changes of the lower lumbar spine.  IMPRESSION: Chest:  Multiple peripheral ground-glass/confluent nodules measuring no greater than  5 mm throughout the lungs in a distribution which, in the absence of known malignancy, is favored to represent inflammatory process such as bronchitis/bronchiolitis. Other sources of infection should be included on the differential including septic emboli. Follow-up imaging once the patient has cleared this acute process would be useful to assure resolution.  Mild bronchial wall thickening with a few small airways containing debris, supporting the diagnosis of bronchitis/bronchiolitis.  Circumferential thickening of the distal  esophagus, with adjacent borderline enlarged lymph nodes. Correlation with a history of GERD and potentially upper endoscopy is recommended.  Abdomen:  No acute abdominal process.  5.3 cm cyst associated with the right adnexa. SRU consensus guidelines suggest following a cyst of this size with annual ultrasound whether premenopausal or postmenopausal. Initiation into surveillance is recommended.  Hepatomegaly and steatosis.  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  Signed,  Yvone NeuJaime S. Loreta AveWagner, DO  [facility]  [facility]   Electronically Signed   By: Gilmer MorJaime  Wagner D.O.   On: 04/09/2014 18:59   Ct Abdomen Pelvis W Contrast  04/09/2014   CLINICAL DATA:  54 year old female with a history of generalized weakness, dizziness, fever. Elevated LFTs.  EXAM: CT CHEST, ABDOMEN, AND PELVIS WITH CONTRAST  TECHNIQUE: Multidetector CT imaging of the chest, abdomen and pelvis was performed following the standard protocol during bolus administration of intravenous contrast.  CONTRAST:  100mL OMNIPAQUE IOHEXOL 350 MG/ML SOLN  COMPARISON:  None.  FINDINGS: CT CHEST FINDINGS  Unremarkable appearance of the superficial soft tissues of the chest.  Unremarkable appearance of the thoracic inlet and thyroid gland.  Axillary lymph nodes are present, though are not enlarged by CT size criteria. No supraclavicular adenopathy.  Central airways are patent with no debris. There are a few more peripheral airways with debris within the lumen (such as left lower lobe image 87 of series 402).  The arm lymph nodes within the mediastinum, not enlarged by CT size criteria.  Heart size within normal limits with no pericardial fluid/ thickening. No calcifications of the coronary vasculature.  There is circumferential thickening of the distal esophagus. Several lymph nodes are present in the periesophageal stations at the esophageal hiatus. Largest measures approximately 6 mm to 7  mm on image 80 of series 401.  Wedge-shaped ground-glass/reticular opacity at the periphery of the right upper lobe, abutting the minor fissure. Geographic ground-glass throughout the lungs, with some regions of hyper aerated lung.  Several ill-defined nodules are present throughout the lungs, including the largest in the left upper lobe on image 50 of series 402 measuring 5 mm. Majority of the nodules are at the periphery of the lungs, with a centrilobular distribution.  A few of the distal airways demonstrate debris within the airways.  No confluent airspace disease, pleural effusion, or pneumothorax.  Unremarkable appearance of the thoracic aorta without aneurysm, dissection flap, periaortic fluid. No significant atherosclerotic changes. Branch vessels are patent, with 3 vessel arch.  Unremarkable appearance of the main pulmonary artery, with no central, lobar, segmental, or subsegmental filling defects.  The distal pulmonary arteries are slightly more prominent than expected, greater than the size of the match to bronchi in the outer third of the lungs.  CT ABDOMEN AND PELVIS FINDINGS  Diffusely decreased attenuation/ enhancement of liver parenchyma with a cranial caudal span measuring 21 cm. Focal fatty sparing adjacent to the falciform ligament. Unremarkable appearance of spleen.  Unremarkable appearance of the bilateral adrenal glands.  Surgical changes of prior cholecystectomy.  No peripancreatic  fluid or inflammatory changes.  No intrahepatic or extrahepatic biliary ductal dilatation.  No evidence of left or right hydronephrosis. No evidence of nephrolithiasis.  There are bilateral sub cm rounded hypodensities within the renal cortex, too small to completely characterize. No perinephric stranding.  No abnormally distended small bowel or colon. Enteric contrast reaches the distal colon  The terminal ileum is adjacent is segment 6, with the cecum looped into the liver hilum. The appendix is not identified,  though there are no inflammatory changes adjacent to the cecum.  Multiple colonic diverticula in the sigmoid colon without associated inflammatory changes.  No free air or significant free fluid.  Several mediastinal lymph nodes adjacent to the esophagus, as noted above. Lymph nodes within the small bowel mesentery and preaortic/ para-aortic nodal stations, none of which are enlarged by CT size criteria.  Unremarkable appearance of the abdominal aorta without aneurysm dissection flap or periaortic fluid. Minimal atherosclerotic changes are present.  Mesenteric vessels are patent.  Evidence of fibroids involving the uterus.  Trace endometrial fluid.  Low-density cystic lesion associated with the right adnexa measuring 5.3 cm. Unremarkable appearance of the left adnexa.  No displaced fracture. Mild degenerative changes of the lower lumbar spine.  IMPRESSION: Chest:  Multiple peripheral ground-glass/confluent nodules measuring no greater than 5 mm throughout the lungs in a distribution which, in the absence of known malignancy, is favored to represent inflammatory process such as bronchitis/bronchiolitis. Other sources of infection should be included on the differential including septic emboli. Follow-up imaging once the patient has cleared this acute process would be useful to assure resolution.  Mild bronchial wall thickening with a few small airways containing debris, supporting the diagnosis of bronchitis/bronchiolitis.  Circumferential thickening of the distal esophagus, with adjacent borderline enlarged lymph nodes. Correlation with a history of GERD and potentially upper endoscopy is recommended.  Abdomen:  No acute abdominal process.  5.3 cm cyst associated with the right adnexa. SRU consensus guidelines suggest following a cyst of this size with annual ultrasound whether premenopausal or postmenopausal. Initiation into surveillance is recommended.  Hepatomegaly and steatosis.  Diverticular disease without  associated acute inflammatory changes.  Signed,  Yvone Neu. Loreta Ave, DO  [facility]  [facility]   Electronically Signed   By: Gilmer Mor D.O.   On: 04/09/2014 18:59     PERTINENT LAB RESULTS: CBC:  Recent Labs  04/10/14 0549 04/11/14 0540  WBC 5.9 5.0  HGB 13.1 13.5  HCT 40.1 41.6  PLT 203 199   CMET CMP     Component Value Date/Time   NA 135 04/11/2014 0540   K 3.6 04/11/2014 0540   CL 103 04/11/2014 0540   CO2 27 04/11/2014 0540   GLUCOSE 95 04/11/2014 0540   BUN <5* 04/11/2014 0540   CREATININE 0.52 04/11/2014 0540   CALCIUM 8.1* 04/11/2014 0540   PROT 7.0 04/11/2014 0540   ALBUMIN 3.3* 04/11/2014 0540   AST 42* 04/11/2014 0540   ALT 59* 04/11/2014 0540   ALKPHOS 116 04/11/2014 0540   BILITOT 0.6 04/11/2014 0540   GFRNONAA >90 04/11/2014 0540   GFRAA >90 04/11/2014 0540    GFR Estimated Creatinine Clearance: 74.6 mL/min (by C-G formula based on Cr of 0.52).  Recent Labs  04/09/14 1108  LIPASE 42   No results for input(s): CKTOTAL, CKMB, CKMBINDEX, TROPONINI in the last 72 hours. Invalid input(s): POCBNP No results for input(s): DDIMER in the last 72 hours. No results for input(s): HGBA1C in the last  72 hours. No results for input(s): CHOL, HDL, LDLCALC, TRIG, CHOLHDL, LDLDIRECT in the last 72 hours. No results for input(s): TSH, T4TOTAL, T3FREE, THYROIDAB in the last 72 hours.  Invalid input(s): FREET3 No results for input(s): VITAMINB12, FOLATE, FERRITIN, TIBC, IRON, RETICCTPCT in the last 72 hours. Coags: No results for input(s): INR in the last 72 hours.  Invalid input(s): PT Microbiology: Recent Results (from the past 240 hour(s))  Urine culture     Status: None   Collection Time: 04/09/14  1:07 PM  Result Value Ref Range Status   Specimen Description URINE, RANDOM  Final   Special Requests ADDED [id number]  Final   Colony Count   Final    50,000 COLONIES/ML Performed at [facility]     Culture   Final    Multiple bacterial morphotypes present, none predominant. Suggest appropriate recollection if clinically indicated. Performed at [facility]    Report Status 04/11/2014 FINAL  Final  Culture, blood (routine x 2)     Status: None (Preliminary result)   Collection Time: 04/09/14  9:05 PM  Result Value Ref Range Status   Specimen Description BLOOD ARM RIGHT  Final   Special Requests BOTTLES DRAWN AEROBIC AND ANAEROBIC 10CC  Final   Culture   Final           BLOOD CULTURE RECEIVED NO GROWTH TO DATE CULTURE WILL BE HELD FOR 5 DAYS BEFORE ISSUING A FINAL NEGATIVE REPORT Performed at [facility]    Report Status PENDING  Incomplete  Culture, blood (routine x 2)     Status: None (Preliminary result)   Collection Time: 04/09/14  9:15 PM  Result Value Ref Range Status   Specimen Description BLOOD HAND RIGHT  Final   Special Requests BOTTLES DRAWN AEROBIC ONLY 8CC  Final   Culture   Final           BLOOD CULTURE RECEIVED NO GROWTH TO DATE CULTURE WILL BE HELD FOR 5 DAYS BEFORE ISSUING A FINAL NEGATIVE REPORT Performed at [facility]    Report Status PENDING  Incomplete     BRIEF HOSPITAL COURSE:  Acute respiratory failure with hypoxia: Likely secondary to Influenza A. Resolved   Influenza A: Likely cause for febrile illness and cough. Started on Tamiflu. Complete 5 day course. Afebrile, significantly improved by the time of discharge.   ?NUU:[id number] lung nodules, empirically started on Levaquin- which is being continued on discharge. Will need repeat CT Chest in 4- 6 weeks to ensure resolution of these lung nodules.   Elevated LFTs: Decreasing, and only minimally elevated.  Hepatitis panel negative.     SIRS (systemic inflammatory response syndrome): Resolved with IVF, Tamiflu and Levaquin. Blood cultures was negative.  Ovarian cyst: 5.3 cm cyst associated with the right adnexa,will need GYN follow-up as outpatient.   Thickened  distal esophagus :EGD as outpatient.On PPI on discharge.  TODAY-DAY OF DISCHARGE:  Subjective:   Andrea Villegas today has no headache,no chest abdominal pain,no new weakness tingling or numbness, feels much better wants to go home today.   Objective:   Blood pressure 94/56, pulse 84, temperature 98.7 F (37.1 C), temperature source Oral, resp. rate 18, height  (1.499 m), weight 77.2 kg (170 lb 3.1 oz), SpO2 97 %.  Intake/Output Summary (Last 24 hours) at 04/12/14 1029 Last data filed at 04/12/14 0730  Gross per 24 hour  Intake    240 ml  Output    750 ml  Net   -  510 ml   Filed Weights   04/10/14 0500  Weight: 77.2 kg (170 lb 3.1 oz)    Exam Awake Alert, Oriented *3, No new F.N deficits, Normal affect [place].AT,PERRAL Supple Neck,No JVD, No cervical lymphadenopathy appriciated.  Symmetrical Chest wall movement, Good air movement bilaterally, CTAB RRR,No Gallops,Rubs or new Murmurs, No Parasternal Heave +ve B.Sounds, Abd Soft, Non tender, No organomegaly appriciated, No rebound -guarding or rigidity. No Cyanosis, Clubbing or edema, No new Rash or bruise  DISCHARGE CONDITION: Stable  DISPOSITION: Home  DISCHARGE INSTRUCTIONS:    Activity:  As tolerated   Diet recommendation: Regular Diet  Discharge Instructions    Call MD for:  difficulty breathing, headache or visual disturbances    Complete by:  As directed      Increase activity slowly    Complete by:  As directed            Follow-up Information    Follow up with  [facility]    .   Why:  to establish care with a primary care provider   Contact information:   [address] [place] [address] [phone]      Follow up with [facility].   Why:  for Ob-Gyn care   Contact information:   [address] [place] [address] [phone]      Total Time spent on discharge equals 45  minutes.  SignedJeoffrey Massed 04/12/2014 10:29 AM

## 2014-04-12 NOTE — Discharge Instructions (Signed)
Influenza Influenza ("the flu") is a viral infection of the respiratory tract. It occurs more often in winter months because people spend more time in close contact with one another. Influenza can make you feel very sick. Influenza easily spreads from person to person (contagious). CAUSES  Influenza is caused by a virus that infects the respiratory tract. You can catch the virus by breathing in droplets from an infected person's cough or sneeze. You can also catch the virus by touching something that was recently contaminated with the virus and then touching your mouth, nose, or eyes. RISKS AND COMPLICATIONS You may be at risk for a more severe case of influenza if you smoke cigarettes, have diabetes, have chronic heart disease (such as heart failure) or lung disease (such as asthma), or if you have a weakened immune system. Elderly people and pregnant women are also at risk for more serious infections. The most common problem of influenza is a lung infection (pneumonia). Sometimes, this problem can require emergency medical care and may be life threatening. SIGNS AND SYMPTOMS  Symptoms typically last 4 to 10 days and may include:  Fever.  Chills.  Headache, body aches, and muscle aches.  Sore throat.  Chest discomfort and cough.  Poor appetite.  Weakness or feeling tired.  Dizziness.  Nausea or vomiting. DIAGNOSIS  Diagnosis of influenza is often made based on your history and a physical exam. A nose or throat swab test can be done to confirm the diagnosis. TREATMENT  In mild cases, influenza goes away on its own. Treatment is directed at relieving symptoms. For more severe cases, your health care provider may prescribe antiviral medicines to shorten the sickness. Antibiotic medicines are not effective because the infection is caused by a virus, not by bacteria. HOME CARE INSTRUCTIONS  Take medicines only as directed by your health care provider.  Use a cool mist humidifier to  make breathing easier.  Get plenty of rest until your temperature returns to normal. This usually takes 3 to 4 days.  Drink enough fluid to keep your urine clear or pale yellow.  Cover yourmouth and nosewhen coughing or sneezing,and wash your handswellto prevent thevirusfrom spreading.  Stay homefromwork orschool untilthe fever is gonefor at least 55full day. PREVENTION  An annual influenza vaccination (flu shot) is the best way to avoid getting influenza. An annual flu shot is now routinely recommended for all adults in the [place] SEEK MEDICAL CARE IF:  You experiencechest pain, yourcough worsens,or you producemore mucus.  Youhave nausea,vomiting, ordiarrhea.  Your fever returns or gets worse. SEEK IMMEDIATE MEDICAL CARE IF:  You havetrouble breathing, you become short of breath,or your skin ornails becomebluish.  You have severe painor stiffnessin the neck.  You develop a sudden headache, or pain in the face or ear.  You have nausea or vomiting that you cannot control. MAKE SURE YOU:   Understand these instructions.  Will watch your condition.  Will get help right away if you are not doing well or get worse. Document Released: 01/29/2000 Document Revised: 06/17/2013 Document Reviewed: 05/02/2011 [facility] Patient Information 2015 [place], [place]. This information is not intended to replace advice given to you by your health care provider. Make sure you discuss any questions you have with your health care provider.    Further instructions: Please follow the directions provided.  Use the resource guide to find a primary care provider to follow-up with to ensure you are getting better.  Your CT Abd also shows a cyst on your ovary  that you can follow-up with an Ob/Gyn for further management. Use the Tamiflu as directed.  Please ask your Primary MD to check a Chest Xray or a CT Chest in 4-6 weeks to make sure the lung nodules have resolved  SEEK IMMEDIATE  MEDICAL CARE IF:  You have trouble breathing, you become short of breath, or your skin or nails become bluish.  You have severe pain or stiffness in the neck.  You develop a sudden headache, or pain in the face or ear.  You have nausea or vomiting that you cannot control.   Emergency Department Resource Guide 1) Find a Doctor and Pay Out of Pocket Although you won't have to find out who is covered by your insurance plan, it is a good idea to ask around and get recommendations. You will then need to call the office and see if the doctor you have chosen will accept you as a new patient and what types of options they offer for patients who are self-pay. Some doctors offer discounts or will set up payment plans for their patients who do not have insurance, but you will need to ask so you aren't surprised when you get to your appointment.  2) Contact Your Local Health Department Not all health departments have doctors that can see patients for sick visits, but many do, so it is worth a call to see if yours does. If you don't know where your local health department is, you can check in your phone book. The CDC also has a tool to help you locate your state's health department, and many state websites also have listings of all of their local health departments.  3) Find a Walk-in Clinic If your illness is not likely to be very severe or complicated, you may want to try a walk in clinic. These are popping up all over the country in pharmacies, drugstores, and shopping centers. They're usually staffed by nurse practitioners or physician assistants that have been trained to treat common illnesses and complaints. They're usually fairly quick and inexpensive. However, if you have serious medical issues or chronic medical problems, these are probably not your best option.  No Primary Care Doctor: - Call Health Connect at  [phone] - they can help you locate a primary care doctor that  accepts your insurance,  provides certain services, etc. - Physician Referral Service- [phone]  Chronic Pain Problems: Organization         Address  Phone   Notes  [facility]  [phone] Patients need to be referred by their primary care doctor.   Medication Assistance: Organization         Address  Phone   Notes  [facility] [address] [place] [place], [place] [address] [phone] --Must be a resident of [facility] -- Must have NO insurance coverage whatsoever (no Medicaid/ Medicare, etc.) -- The pt. MUST have a primary care doctor that directs their care regularly and follows them in the community   [facility]ist  [phone]   [employer]  [phone]    Agencies that provide inexpensive medical care: Organization         Address  Phone   Notes  [facility]  [phone]   [facility]    [phone]   [facility] [address] [place], [place] [address] [phone]   [facility] [address] [place]  [place] [phone]   Planned Parenthood    [phone]   [facility]    [phone]   Community [facility] and [facility]  [address], [place] Phone:  [phone], Fax:  [phone] Hours of Operation:  9 am - 6 pm, M-F.  Also accepts Medicaid/Medicare and self-pay.  [facility]  [address], [place] Phone: [phone], Fax: [phone]. Hours of Operation:  8:30 am - 5:30 pm, M-F.  Also accepts Medicaid and self-pay.  [facility] [place] [address], [place] Phone: [phone]   [facility] [address] Natasha Bence [place], [place] [phone], Ext. [phone] Mondays & Thursdays: 7-9 AM.  First 15 patients are seen on a first come, first serve basis.    Medicaid-accepting [facility] Providers:  Organization         Address  Phone    Notes  [facility] [address], [place] [phone] Also accepts self-pay patients.  [facility] [address] Laurell Josephs [employer], [place]  [phone]   [facility] [address], [place] [phone]   [facility] [address], [place] [phone]   Renaye Rakers [address], [place]   [phone] Only accepts [place] patients after they have their name applied to their card.   Self-Pay (no insurance) in [facility]:  Organization         Address  Phone   Notes  Sickle Cell Patients, [facility] [address] [place], [place] [phone]   [facility] [address] [place], [place] [phone]   [facility] [place]  [address], [place] [phone]   [facility]/Dr. Osei-Bonsu  [address], [place] [employer] [employer] or [address], [place] [phone] Phone number for both [place] and [place] locations is the same.  Urgent Medical and [facility] [address], [place] [phone]   [facility] [address] [place] or [address] [phone]   [facility] [address] [place] [phone], phone; [phone], fax Sees patients 1st and 3rd Saturday of every month.  Must not qualify for public or private insurance (i.e. Medicaid, Medicare, [place] Health Choice, Veterans' Benefits)  Household income should be no more than 200% of the poverty level The clinic cannot treat you if you are pregnant or think you are pregnant  Sexually transmitted diseases are not treated at the clinic.    Dental Care: Organization         Address  Phone  Notes  [facility] [address] [place], [place] [phone] Accepts children up to age 93 who are enrolled in [place] or [place] Health Choice; pregnant women with a Medicaid card; and children who have applied for Medicaid or [place] Health Choice, but were declined, whose parents can pay a reduced fee at time of service.  [facility] Department of [facility]  [address], [place] [phone] Accepts children up to age 78 who are enrolled in [place] or [place] Health Choice; pregnant women with a Medicaid card; and children who have applied for Medicaid or [place]  Health Choice, but were declined, whose parents can pay a reduced fee at time of service.  [facility]  [address] [place], [place] [phone] Patients are seen by appointment only. Walk-ins are not accepted. [facility] will see patients 60 years of age and older. Monday - Tuesday (8am-5pm) Most Wednesdays (8:30-5pm) $30 per visit, cash only  [facility] Access [facility]  [address], [facility] [phone] Patients are seen by appointment only. Walk-ins are not accepted. [facility] will see patients 17 years of age and older. One Wednesday Evening (Monthly: Volunteer Based).  $30 per visit, cash only  [employer]  [phone] for adults; Children under age 51, call Graduate Pediatric Dentistry at [phone]. Children aged 37-14, please call [phone] to request a pediatric application.  Dental services are provided in all areas of dental care including fillings, crowns and bridges, complete and partial dentures, implants, gum treatment, root canals, and extractions. Preventive care is also provided. Treatment is provided to both adults and children. Patients are selected via a lottery and there is often a waiting list.   [facility] [address], [place]  [phone] [email or web address]   [facility] [address] [place], [place] [phone], Ext.  123 Second and Fourth Thursday of each month, opens at 6:30 AM; Clinic ends at 9 AM.  Patients are seen on a first-come first-served basis, and a limited number are seen during each clinic.   [facility]  [address] Ether Griffins [place], [place] [phone]   Eligibility Requirements You must have lived in [place], [place], or [place] counties for at least the last three months.   You cannot be eligible for state or federal sponsored [place], including [employer], [place], or [employer].   You generally cannot be eligible for healthcare insurance through your employer.    How to apply: Eligibility screenings are held every Tuesday and Wednesday afternoon from 1:00 pm until 4:00 pm. You do not need an appointment for the interview!  [facility] [address] [place], [place] [phone]   [facility] Health Department  [phone]   [facility] Health Department  [phone]   [facility] Health Department  [phone]    Behavioral Health Resources in the Community: Intensive Outpatient Programs Organization         Address  Phone  Notes  [facility] Services [address] [place], [place] [phone]   [facility] Outpatient [address], [place], [place] [phone]   [facility] [address], [place], [place]  [phone]   [facility] [address],  [place], [place] [phone] or [phone]   Substance Abuse Resources Organization         Address  Phone  Notes  [facility]  [phone]   [facility]  [phone]   [facility]  [phone]   Floydene Flock  [phone]   Residential & Outpatient Substance Abuse Program  [phone]   Psychological Services Organization         Address  Phone  Notes  [facility] Behavioral Health  [phone]   [facility] Services  [phone]   [facility]  Mental Health [address] [place] [phone] or [phone]    Mobile Crisis Teams Organization         Address  Phone  Notes  [facility], Mobile Crisis Care Unit  [phone]   [facility]  [address] [place], [place] [phone]   [facility] [address] [place] [phone]    Self-Help/Support Groups Organization         Address  Phone             Notes  [facility] [place] - variety of support groups  336- [phone] Call for more information  Narcotics Anonymous (NA), [facility] [address], [place]  2 meetings at this location   Statisticianesidential Treatment Programs Organization         Address  Phone  Notes  [facility] [address],    [place]  [phone]   [facility]  [address], [place] [phone], [place], [place] [phone]   [facility] [address], [place] [phone] Admissions: 8am-3pm M-F  [facility] [address]    [place], [place] [phone]   [facility] [address], [place], [place] [phone]   [facility] [address]  [place], [place] [phone]   [facility] - Intensive Outpatient [address], [place], [place] [phone]   [facility] ([facility] [address]  [place], [place] [phone] or [phone]   [facility] ([facility]) [address] [place], [place] [phone] Accepts Medicaid  [facility] [address]  [place] [phone] Substance Abuse/Addiction Treatment   [facility] Behavioral Health Resources Organization         Address  Phone  Notes  [facility]  [phone]) [phone]   Angie FavaJulie Brannon, PhD [address] [place], [place]   [phone] or [phone]   [facility]   [address] [place], [place] [phone]   [facility] [address], [place], [place] [phone](336) [phone] Insurance/Medicaid/sponsorship through [facility]  [facility] [address]                                    [place], [place] [phone]) [phone] Therapy/tele-psych/case  [facility] [address]   [place], [place] [phone]    Dr. Lolly MustacheArfeen  [phone]   [facility] [place]  [employer] [facility] 1) [address], [place] 2) [address], [place] 3)  [address], [place] [phone](336) [phone](336) [phone]  [phone]([phone]   [facility] Child Abuse Hotline [phone]) [phone] or [phone]) [phone] (After Hours)

## 2014-04-16 LAB — CULTURE, BLOOD (ROUTINE X 2)
CULTURE: NO GROWTH
CULTURE: NO GROWTH

## 2014-04-20 LAB — RESPIRATORY VIRUS PANEL
ADENOVIRUS: NEGATIVE
INFLUENZA B 1: NEGATIVE
Influenza A: NEGATIVE
Metapneumovirus: NEGATIVE
PARAINFLUENZA 1 A: NEGATIVE
PARAINFLUENZA 3 A: NEGATIVE
Parainfluenza 2: NEGATIVE
Respiratory Syncytial Virus A: POSITIVE — AB
Respiratory Syncytial Virus B: NEGATIVE
Rhinovirus: NEGATIVE

## 2014-07-16 ENCOUNTER — Other Ambulatory Visit (HOSPITAL_COMMUNITY)
Admission: RE | Admit: 2014-07-16 | Discharge: 2014-07-16 | Disposition: A | Discharge status: Home / Self Care | Payer: Medicaid Other | Source: Ambulatory Visit | Attending: Obstetrics and Gynecology | Admitting: Obstetrics and Gynecology

## 2014-07-16 ENCOUNTER — Ambulatory Visit (INDEPENDENT_AMBULATORY_CARE_PROVIDER_SITE_OTHER): Payer: Medicaid Other | Admitting: Obstetrics and Gynecology

## 2014-07-16 ENCOUNTER — Encounter: Payer: Self-pay | Admitting: Obstetrics and Gynecology

## 2014-07-16 VITALS — BP 123/75 | HR 99 | Temp 98.6°F | Ht 60.0 in | Wt 168.1 lb

## 2014-07-16 DIAGNOSIS — Z1231 Encounter for screening mammogram for malignant neoplasm of breast: Secondary | ICD-10-CM

## 2014-07-16 DIAGNOSIS — N83201 Unspecified ovarian cyst, right side: Secondary | ICD-10-CM

## 2014-07-16 DIAGNOSIS — N8320 Unspecified ovarian cysts: Secondary | ICD-10-CM

## 2014-07-16 DIAGNOSIS — Z01419 Encounter for gynecological examination (general) (routine) without abnormal findings: Secondary | ICD-10-CM | POA: Diagnosis present

## 2014-07-16 DIAGNOSIS — Z1151 Encounter for screening for human papillomavirus (HPV): Secondary | ICD-10-CM | POA: Diagnosis present

## 2014-07-16 DIAGNOSIS — Z124 Encounter for screening for malignant neoplasm of cervix: Secondary | ICD-10-CM

## 2014-07-16 NOTE — Progress Notes (Signed)
Patient ID: Andrea Villegas, female   DOB: Dec 29, 1960, 54 y.o.   MRN: [id number] 54 yo presenting today as an ED follow up for the evaluation of a right ovarian cyst. Patient currently reports feeling well and is without complaints. The cyst was discovered during the workup for a fever of unknown origin. Patient also states that she has not had a pap smear in several years and has never had a mammogram and would like to have both done. She reports being evaluated several years ago for urinary incontinence and was advised that surgical intervention was best. She never had the procedure done due to fear/anxiety. She states that her incontinence has worsen over the years to the point that it is hard for her to hold her urine if she laughs or coughs/sneezes. She is again interested in surgical evaluation. Patient has not had a period in the past 6 months. She denies any abdominal/pelvic pain or abnormal uterine bleeding or discharge. Patient admits to being very anxious at doctors appointment   Past Medical History  Diagnosis Date  . Medical history non-contributory    Past Surgical History  Procedure Laterality Date  . Cholecystectomy    . Tubal ligation     Family History  Problem Relation Age of Onset  . Diabetes Mellitus II Father   . CAD Father    History  Substance Use Topics  . Smoking status: Never Smoker   . Smokeless tobacco: Not on file  . Alcohol Use: No   ROS See pertinent in HPI  GENERAL: Well-developed, well-nourished female in no acute distress. Visibly anxious BREASTS: Symmetric in size. No palpable masses or lymphadenopathy, skin changes, or nipple drainage. ABDOMEN: Soft, nontender, nondistended. Obese PELVIC: Normal external female genitalia. Vagina is pink and rugated.  Normal discharge. Normal appearing cervix. Uterus is normal in size. No adnexal mass or tenderness. EXTREMITIES: No cyanosis, clubbing, or edema, 2+ distal pulses.  04/11/2014 CT abdomen/pelvis- 5.3 cm  right ovarian cyst  A/P 54 yo with a right ovarian cyst seen on CT here for follow up - Will order pelvic ultrasound to better assess quality of ovarian cyst and possible resolution - pap smear collected - Referral for screening mammogram also provided - Discussed referral to my colleague Dr. Marice Potterove for the evaluation and treatment of urinary incontinence - Patient will be contacted with results of pelvic ultrasound and managed accordingly - A total of 30 minutes was spent with this patient

## 2014-07-16 NOTE — Progress Notes (Signed)
US scheduled for 07/24/14 @ 1500

## 2014-07-17 LAB — CYTOLOGY - PAP

## 2014-07-24 ENCOUNTER — Ambulatory Visit (HOSPITAL_COMMUNITY)
Admission: RE | Admit: 2014-07-24 | Discharge: 2014-07-24 | Disposition: A | Discharge status: Home / Self Care | Payer: Medicaid Other | Source: Ambulatory Visit | Attending: Obstetrics and Gynecology | Admitting: Obstetrics and Gynecology

## 2014-07-24 DIAGNOSIS — N8320 Unspecified ovarian cysts: Secondary | ICD-10-CM | POA: Diagnosis not present

## 2014-07-24 DIAGNOSIS — Z1231 Encounter for screening mammogram for malignant neoplasm of breast: Secondary | ICD-10-CM

## 2014-07-24 DIAGNOSIS — N83201 Unspecified ovarian cyst, right side: Secondary | ICD-10-CM

## 2014-07-25 ENCOUNTER — Other Ambulatory Visit: Payer: Self-pay | Admitting: Obstetrics and Gynecology

## 2014-07-25 DIAGNOSIS — R928 Other abnormal and inconclusive findings on diagnostic imaging of breast: Secondary | ICD-10-CM

## 2014-07-28 ENCOUNTER — Telehealth: Payer: Self-pay | Admitting: General Practice

## 2014-07-28 NOTE — Telephone Encounter (Signed)
Telephone call to patient regarding need for follow up ultrasound in September due to ovarian cyst. Also patient needs to see Dr Marice Potter for follow up of urinary incontinence. No answer- left message stating we are trying to reach you in regards to setting up an appt, please call us back at the clinics

## 2014-08-01 ENCOUNTER — Ambulatory Visit
Admission: RE | Admit: 2014-08-01 | Discharge: 2014-08-01 | Disposition: A | Discharge status: Home / Self Care | Payer: Medicaid Other | Source: Ambulatory Visit | Attending: Obstetrics and Gynecology | Admitting: Obstetrics and Gynecology

## 2014-08-01 DIAGNOSIS — R928 Other abnormal and inconclusive findings on diagnostic imaging of breast: Secondary | ICD-10-CM

## 2014-08-06 ENCOUNTER — Encounter: Payer: Self-pay | Admitting: *Deleted

## 2014-08-06 NOTE — Telephone Encounter (Signed)
Certified letter to patient asking her to call to set up appointments.

## 2014-10-06 ENCOUNTER — Encounter: Payer: Self-pay | Admitting: Obstetrics & Gynecology

## 2014-10-06 ENCOUNTER — Telehealth: Payer: Self-pay | Admitting: *Deleted

## 2014-10-06 DIAGNOSIS — N83209 Unspecified ovarian cyst, unspecified side: Secondary | ICD-10-CM

## 2014-10-06 NOTE — Telephone Encounter (Addendum)
Andrea Villegas came to window requesting to see someone for results. Per chart was seen by Dr. Jolayne Villegas and a phone call made but did not reach Andrea Villegas, letter sent.  Andrea Villegas states she hasn't got the letter because can't get there because of work .  IInformed her Dr. Jolayne Villegas ordered another ultrasound to follow up ovarian cyst  To be done in September and referred her to Dr. Marice Villegas for her urinary incontinence.  Will schedule ultrasound today for September and will schedule Dr. Marice Villegas appointment with registar.   She also asked for results of mammogram and diagnostic mammogram.  Reviewed results with her and also the reccomendation she have her next screening mammogram in one year - 07/2015.  I also informed her is she has other questions she should speak with [facility].  She voices understanding.

## 2014-10-23 ENCOUNTER — Ambulatory Visit (HOSPITAL_COMMUNITY): Admission: RE | Admit: 2014-10-23 | Payer: Medicaid Other | Source: Ambulatory Visit

## 2014-11-10 ENCOUNTER — Ambulatory Visit: Payer: Medicaid Other | Admitting: Obstetrics & Gynecology

## 2015-01-09 ENCOUNTER — Emergency Department (HOSPITAL_COMMUNITY)
Admission: EM | Admit: 2015-01-09 | Discharge: 2015-01-09 | Discharge status: Against Medical Advice | Payer: Medicaid Other

## 2015-01-09 NOTE — ED Notes (Signed)
Call no answer 

## 2015-01-09 NOTE — ED Notes (Signed)
Called no answer

## 2015-01-10 ENCOUNTER — Emergency Department (HOSPITAL_COMMUNITY)
Admission: EM | Admit: 2015-01-10 | Discharge: 2015-01-10 | Disposition: A | Discharge status: Home / Self Care | Payer: Medicaid Other | Attending: Emergency Medicine | Admitting: Emergency Medicine

## 2015-01-10 ENCOUNTER — Emergency Department (HOSPITAL_COMMUNITY): Payer: Medicaid Other

## 2015-01-10 ENCOUNTER — Encounter (HOSPITAL_COMMUNITY): Payer: Self-pay | Admitting: Nurse Practitioner

## 2015-01-10 DIAGNOSIS — J069 Acute upper respiratory infection, unspecified: Secondary | ICD-10-CM

## 2015-01-10 DIAGNOSIS — Z79899 Other long term (current) drug therapy: Secondary | ICD-10-CM | POA: Insufficient documentation

## 2015-01-10 HISTORY — DX: Other nonspecific abnormal finding of lung field: R91.8

## 2015-01-10 LAB — BASIC METABOLIC PANEL
Anion gap: 6 (ref 5–15)
BUN: 14 mg/dL (ref 6–20)
CALCIUM: 9.2 mg/dL (ref 8.9–10.3)
CO2: 26 mmol/L (ref 22–32)
CREATININE: 0.62 mg/dL (ref 0.44–1.00)
Chloride: 104 mmol/L (ref 101–111)
GFR calc Af Amer: >60 mL/min (ref >60)
GFR calc non Af Amer: >60 mL/min (ref >60)
GLUCOSE: 89 mg/dL (ref 65–99)
Potassium: 4.1 mmol/L (ref 3.5–5.1)
Sodium: 136 mmol/L (ref 135–145)

## 2015-01-10 LAB — CBC WITH DIFFERENTIAL/PLATELET
Basophils Absolute: 0.1 10*3/uL (ref 0.0–0.1)
Basophils Relative: 1 %
EOS PCT: 5 %
Eosinophils Absolute: 0.5 10*3/uL (ref 0.0–0.7)
HCT: 43.2 % (ref 36.0–46.0)
Hemoglobin: 14.4 g/dL (ref 12.0–15.0)
LYMPHS PCT: 34 %
Lymphs Abs: 3.6 10*3/uL (ref 0.7–4.0)
MCH: 30.4 pg (ref 26.0–34.0)
MCHC: 33.3 g/dL (ref 30.0–36.0)
MCV: 91.1 fL (ref 78.0–100.0)
MONO ABS: 0.9 10*3/uL (ref 0.1–1.0)
MONOS PCT: 9 %
Neutro Abs: 5.5 10*3/uL (ref 1.7–7.7)
Neutrophils Relative %: 51 %
PLATELETS: 261 10*3/uL (ref 150–400)
RBC: 4.74 MIL/uL (ref 3.87–5.11)
RDW: 13.8 % (ref 11.5–15.5)
WBC: 10.6 10*3/uL — ABNORMAL HIGH (ref 4.0–10.5)

## 2015-01-10 LAB — RAPID STREP SCREEN (MED CTR MEBANE ONLY): Streptococcus, Group A Screen (Direct): NEGATIVE

## 2015-01-10 MED ORDER — HYDROCODONE-HOMATROPINE 5-1.5 MG/5ML PO SYRP
5.0000 mL | ORAL_SOLUTION | Freq: Once | ORAL | Status: AC
  Administered 2015-01-10: 5 mL via ORAL
  Filled 2015-01-10: qty 5

## 2015-01-10 MED ORDER — IBUPROFEN 600 MG PO TABS: 600.0000 mg | ORAL_TABLET | Freq: Four times a day (QID) | ORAL | Status: DC | PRN

## 2015-01-10 MED ORDER — HYDROCODONE-HOMATROPINE 5-1.5 MG/5ML PO SYRP: 5.0000 mL | ORAL_SOLUTION | Freq: Four times a day (QID) | ORAL | Status: DC | PRN

## 2015-01-10 MED ORDER — HYDROCODONE-ACETAMINOPHEN 5-325 MG PO TABS
1.0000 | ORAL_TABLET | Freq: Once | ORAL | Status: AC
  Administered 2015-01-10: 1 via ORAL
  Filled 2015-01-10: qty 1

## 2015-01-10 NOTE — ED Notes (Addendum)
She c/o 3 day history sore throat, chest congestion, productive cough, headache, fevers, fatigue, malaise. She has tried several otc meds with no relief. She is A&Ox4, resp e/u

## 2015-01-10 NOTE — ED Provider Notes (Signed)
CSN: [id number]     Arrival date & time 01/10/15  1324 History   First MD Initiated Contact with Patient 01/10/15 1338     Chief Complaint  Patient presents with  . Headache     (Consider location/radiation/quality/duration/timing/severity/associated sxs/prior Treatment) Patient is a 54 y.o. female presenting with headaches. The history is provided by the patient. No language interpreter was used.  Headache Associated symptoms: cough, fever and sore throat   Associated symptoms: no abdominal pain, no neck pain and no vomiting   Andrea Villegas is a 54 year old female with no significant past medical history who presents for sore throat, chest congestion, productive green sputum cough, generalized headache, and fever 3 days. She also complains of a subjective fever and states that she took Motrin at 7:30 AM. She broke out in a sweat and felt much better. She denies that this is the worst headache of her life. She denies any vision changes, neck pain, chest pain, shortness of breath, abdominal pain, nausea, vomiting, or diarrhea. She states there has been a coworker who was sick with a fever recently. Status post tubal ligation.  Past Medical History  Diagnosis Date  . Medical history non-contributory   . Lung nodules    Past Surgical History  Procedure Laterality Date  . Cholecystectomy    . Tubal ligation     Family History  Problem Relation Age of Onset  . Diabetes Mellitus II Father   . CAD Father    Social History  Substance Use Topics  . Smoking status: Never Smoker   . Smokeless tobacco: None  . Alcohol Use: No   OB History    No data available     Review of Systems  Constitutional: Positive for fever.  HENT: Positive for sore throat.   Respiratory: Positive for cough. Negative for shortness of breath and wheezing.   Cardiovascular: Negative for chest pain.  Gastrointestinal: Negative for vomiting and abdominal pain.  Musculoskeletal: Negative for neck pain.   Neurological: Positive for headaches.  All other systems reviewed and are negative.     Allergies  Review of patient's allergies indicates no known allergies.  Home Medications   Prior to Admission medications   Medication Sig Start Date End Date Taking? Authorizing Provider  Biotin 1000 MCG tablet Take 1,000 mcg by mouth daily.   Yes Historical Provider, MD  Multiple Vitamin (MULTIVITAMIN WITH MINERALS) TABS Take 1 tablet by mouth daily.   Yes Historical Provider, MD  vitamin C (ASCORBIC ACID) 500 MG tablet Take 500 mg by mouth daily.   Yes Historical Provider, MD  albuterol (PROVENTIL HFA;VENTOLIN HFA) 108 (90 BASE) MCG/ACT inhaler Inhale 2 puffs into the lungs every 4 (four) hours as needed for wheezing or shortness of breath. Patient not taking: Reported on 07/16/2014 04/12/14   Maretta BeesShanker M Ghimire, MD  benzonatate (TESSALON) 200 MG capsule Take 1 capsule (200 mg total) by mouth 3 (three) times daily as needed for cough. Patient not taking: Reported on 07/16/2014 04/12/14   Maretta BeesShanker M Ghimire, MD  guaifenesin (ROBITUSSIN) 100 MG/5ML syrup Take 10 mLs (200 mg total) by mouth every 4 (four) hours as needed for congestion. Patient not taking: Reported on 07/16/2014 04/12/14   Maretta BeesShanker M Ghimire, MD  HYDROcodone-acetaminophen (NORCO/VICODIN) 5-325 MG per tablet Take 1 tablet by mouth every 6 (six) hours as needed. Patient not taking: Reported on 07/16/2014 04/12/14   Maretta BeesShanker M Ghimire, MD  HYDROcodone-homatropine [facility](HYCODAN) 5-1.5 MG/5ML syrup Take 5 mLs by mouth every 6 (six) hours  as needed for cough. 01/10/15   Tymeka Privette Patel-Mills, PA-C  ibuprofen (ADVIL,MOTRIN) 600 MG tablet Take 1 tablet (600 mg total) by mouth every 6 (six) hours as needed. 01/10/15   Zakye Baby Patel-Mills, PA-C  levofloxacin (LEVAQUIN) 500 MG tablet Take 1 tablet (500 mg total) by mouth daily. Patient not taking: Reported on 07/16/2014 04/12/14   Maretta Bees, MD  oseltamivir (TAMIFLU) 75 MG capsule Take 1 capsule (75 mg total) by  mouth 2 (two) times daily. Patient not taking: Reported on 07/16/2014 04/12/14   Maretta Bees, MD  pantoprazole (PROTONIX) 40 MG tablet Take 1 tablet (40 mg total) by mouth daily at 12 noon. Patient not taking: Reported on 07/16/2014 04/12/14   Maretta Bees, MD  predniSONE (DELTASONE) 10 MG tablet Take 3 tablets (30 mg) daily for 2 days, then, Take 2 tablets (20 mg) daily for 2 days, then, Take 1 tablets (10 mg) daily for 1 days, then stop Patient not taking: Reported on 07/16/2014 04/12/14   Maretta Bees, MD   BP 115/78 mmHg  Pulse 95  Temp(Src) 98.7 F (37.1 C) (Oral)  Resp 18  Ht 5' (1.524 m)  Wt 74.844 kg  BMI 32.22 kg/m2  SpO2 99% Physical Exam  Constitutional: She is oriented to person, place, and time. She appears well-developed and well-nourished. No distress.  HENT:  Head: Normocephalic and atraumatic.  Eyes: Conjunctivae are normal.  Neck: Normal range of motion and full passive range of motion without pain. Neck supple. No spinous process tenderness and no muscular tenderness present. No Brudzinski's sign and no Kernig's sign noted.  Full passive range of motion without pain. Able to touch chin to chest without difficulty or pain.  Cardiovascular: Normal rate, regular rhythm and normal heart sounds.   Pulmonary/Chest: Effort normal and breath sounds normal. No respiratory distress. She has no wheezes. She has no rales.  Lungs clear to auscultation bilaterally. No wheezing or decreased breath sounds.  Abdominal: Soft. She exhibits no distension. There is no tenderness. There is no rebound.  Abdomen is soft and nontender. No guarding or rebound.  Musculoskeletal: Normal range of motion.  Neurological: She is alert and oriented to person, place, and time. She has normal strength. No sensory deficit. Gait normal. GCS eye subscore is 4. GCS verbal subscore is 5. GCS motor subscore is 6.  Cranial nerves III through XII intact. GCS of 15. No sensory deficit. Normal motor and  strength. Ambulatory with steady gait.  Skin: Skin is warm and dry.  Nursing note and vitals reviewed.   ED Course  Procedures (including critical care time) Labs Review Labs Reviewed  CBC WITH DIFFERENTIAL/PLATELET - Abnormal; Notable for the following:    WBC 10.6 (*)    All other components within normal limits  RAPID STREP SCREEN (NOT AT [facility])  CULTURE, GROUP A STREP  BASIC METABOLIC PANEL    Imaging Review Dg Chest 2 View  01/10/2015  CLINICAL DATA:  Cough today.  History of pulmonary nodules. EXAM: CHEST  2 VIEW COMPARISON:  CT chest and PA and lateral chest 04/09/2014. FINDINGS: Mild prominence of the pulmonary interstitium is unchanged. Lungs appear clear. Heart size is normal. No pneumothorax or pleural effusion. Cholecystectomy clips noted. IMPRESSION: No acute disease. Electronically Signed   By: Drusilla Kanner M.D.   On: 01/10/2015 15:04   I have personally reviewed and evaluated these images and lab results as part of my medical decision-making.   EKG Interpretation None  MDM   Final diagnoses:  Viral upper respiratory illness  Patient presents for multiple upper respiratory complaints including sore throat, chest congestion, cough, headache, fever, and fatigue. She has no red flags or focal headache. No signs of meningitis, temporal arteritis, glaucoma, or SAH. Her labs are not concerning and strep is negative. Her chest x-ray is negative for infiltrate, edema, or pneumothorax. She is well-appearing and in no acute distress. I believe this is most likely a viral upper respiratory infection. I discussed this with the patient and explained that I would treat her symptoms. I will give her Hycodan for cough and I discussed taking Motrin or Tylenol for body aches and fever. I explained that she should not take hycodan when working or driving. She has not had a fever while in the ED. Her vital signs have remained stable. I discussed return precautions with the patient  as well as follow-up and she verbally agrees with the plan. Filed Vitals:   01/10/15 1520 01/10/15 1530  BP: 114/73 115/78  Pulse: 96 95  Temp:    Resp:     Medications  HYDROcodone-homatropine (HYCODAN) 5-1.5 MG/5ML syrup 5 mL (5 mLs Oral Given 01/10/15 1522)  HYDROcodone-acetaminophen (NORCO/VICODIN) 5-325 MG per tablet 1 tablet (1 tablet Oral Given 01/10/15 1619)       Catha Gosselin, PA-C 01/12/15 [id number]  Leta Baptist, MD 01/16/15 [phone]

## 2015-01-10 NOTE — ED Notes (Signed)
Patient transported to X-ray 

## 2015-01-10 NOTE — Discharge Instructions (Signed)
Viral Infections A virus is a type of germ. Viruses can cause:  Minor sore throats.  Aches and pains.  Headaches.  Runny nose.  Rashes.  Watery eyes.  Tiredness.  Coughs.  Loss of appetite.  Feeling sick to your stomach (nausea).  Throwing up (vomiting).  Watery poop (diarrhea). HOME CARE   Only take medicines as told by your doctor.  Drink enough water and fluids to keep your pee (urine) clear or pale yellow. Sports drinks are a good choice.  Get plenty of rest and eat healthy. Soups and broths with crackers or rice are fine. GET HELP RIGHT AWAY IF:   You have a very bad headache.  You have shortness of breath.  You have chest pain or neck pain.  You have an unusual rash.  You cannot stop throwing up.  You have watery poop that does not stop.  You cannot keep fluids down.  You or your child has a temperature by mouth above 102 F (38.9 C), not controlled by medicine.  Your baby is older than 3 months with a rectal temperature of 102 F (38.9 C) or higher.  Your baby is 22 months old or younger with a rectal temperature of 100.4 F (38 C) or higher. MAKE SURE YOU:   Understand these instructions.  Will watch this condition.  Will get help right away if you are not doing well or get worse.   This information is not intended to replace advice given to you by your health care provider. Make sure you discuss any questions you have with your health care provider.   Document Released: 01/14/2008 Document Revised: 04/25/2011 Document Reviewed: 07/09/2014 Elsevier Interactive Patient Education 2016 [employer].  Emergency Department Resource Guide 1) Find a Doctor and Pay Out of Pocket Although you won't have to find out who is covered by your insurance plan, it is a good idea to ask around and get recommendations. You will then need to call the office and see if the doctor you have chosen will accept you as a new patient and what types of options they  offer for patients who are self-pay. Some doctors offer discounts or will set up payment plans for their patients who do not have insurance, but you will need to ask so you aren't surprised when you get to your appointment.  2) Contact Your Local Health Department Not all health departments have doctors that can see patients for sick visits, but many do, so it is worth a call to see if yours does. If you don't know where your local health department is, you can check in your phone book. The CDC also has a tool to help you locate your state's health department, and many state websites also have listings of all of their local health departments.  3) Find a Walk-in Clinic If your illness is not likely to be very severe or complicated, you may want to try a walk in clinic. These are popping up all over the country in pharmacies, drugstores, and shopping centers. They're usually staffed by nurse practitioners or physician assistants that have been trained to treat common illnesses and complaints. They're usually fairly quick and inexpensive. However, if you have serious medical issues or chronic medical problems, these are probably not your best option.  No Primary Care Doctor: - Call Health Connect at  [phone] - they can help you locate a primary care doctor that  accepts your insurance, provides certain services, etc. - Physician Referral Service- [phone]  Chronic Pain Problems: Organization         Address  Phone   Notes  [facility]  [phone] Patients need to be referred by their primary care doctor.   Medication Assistance: Organization         Address  Phone   Notes  [facility] Medication [facility] [address] [place], [place] [address] [phone] --Must be a resident of [facility] -- Must have NO insurance coverage whatsoever (no Medicaid/ Medicare, etc.) -- The pt. MUST have a primary care doctor that directs their care  regularly and follows them in the community   MedAssist  [phone]   [employer]  [phone]    Agencies that provide inexpensive medical care: Organization         Address  Phone   Notes  [facility]  [phone]   [facility]    [phone]   [facility] [address] [place], [place] [address] [phone]   [facility] [address] [place] [place] [phone]   Planned Parenthood    [phone]   [facility]    [phone]   [facility]  [address], [place] Phone:  [phone], Fax:  [phone] Hours of Operation:  9 am - 6 pm, M-F.  Also accepts Medicaid/Medicare and self-pay.  [facility]  [address], [place] Phone: [phone], Fax: [phone]. Hours of Operation:  8:30 am - 5:30 pm, M-F.  Also accepts Medicaid and self-pay.  [facility] [place] [address] [place] Phone: [phone]   [facility] [address] Natasha Bence [place], [place] [phone], Ext. [phone] Mondays & Thursdays: 7-9 AM.  First 15 patients are seen on a first come, first serve basis.    Medicaid-accepting [facility] Providers:  Organization         Address  Phone   Notes  [facility] [address], [place] [phone] Also accepts self-pay patients.  [facility] [address] Laurell Josephs [place], [place]  [phone]   [facility] [address], [place] [phone]   [facility] [address] [place] [phone]   Renaye Rakers [address], [place]   [phone] Only accepts [place] patients after they have their name applied to their card.   Self-Pay (no insurance) in [facility]:  Organization         Address  Phone    Notes  Sickle Cell Patients, [facility] [address] [place], [place] [phone]   [facility] [address] [place], [place] [phone]   [facility] [place]  [address], [place] [phone]   [facility]/Dr. Osei-Bonsu  [address] [place] or [address], [place] [phone] Phone number for both [place] and [place] locations is the same.  [facility] [address], [place] [phone]   [facility] [address], [place] or [address] [phone]   [facility] [address], [place] [phone], phone; [phone], fax Sees  patients 1st and 3rd Saturday of every month.  Must not qualify for public or private insurance (i.e. Medicaid, Medicare, [place] Health Choice, Veterans' Benefits)  Household income should be no more than 200% of the poverty level The clinic cannot treat you if you are pregnant or think you are pregnant  Sexually transmitted diseases are not treated at the clinic.    Dental Care: Organization         Address  Phone  Notes  [facility] Department of [facility] [address] [place], [place] [phone] Accepts children up to age 58 who are enrolled in [place] or [place] Health Choice; pregnant women with a Medicaid card; and children who have applied for Medicaid or [place] Health Choice, but were declined, whose parents can pay a reduced fee at time of service.  [facility] Department of [facility]  [address], [place] [phone] Accepts children up to age 46 who are enrolled in [place] or [place] Health Choice; pregnant women with a Medicaid card; and children who have applied for Medicaid or [place] Health Choice, but were declined, whose parents can pay a reduced fee at time of service.  [facility]  [address] [place], [place] [phone] Patients are seen by appointment only. Walk-ins are not accepted. [facility] will see patients 62 years of age and older. Monday - Tuesday (8am-5pm) Most Wednesdays (8:30-5pm) $30 per visit, cash only  [facility] Adult [facility] Access [facility]  [address], [facility] [phone] Patients are seen by appointment only. Walk-ins are not accepted. [facility] will see patients 71 years of age and older. One Wednesday Evening (Monthly: Volunteer Based).  $30 per visit, cash only  [employer]  [phone] for adults; Children under age 39, call Graduate Pediatric Dentistry at [phone]. Children aged 10-14, please call [phone] to request a pediatric application.  Dental services are provided in all areas of dental care including fillings, crowns and bridges, complete and partial dentures, implants, gum treatment, root canals, and extractions. Preventive care is also provided. Treatment is provided to both adults and children. Patients are selected via a lottery and there is often a waiting list.   [facility] [address] [place]  [phone] [email or web address]   [facility] [address] [place], [place] [phone], Ext. [phone] Second and Fourth Thursday of each month, opens at 6:30 AM; Clinic ends at 9 AM.  Patients are seen on a first-come first-served basis, and a limited number are seen during each clinic.   [facility]  [address] Ether Griffins [place], [place] [phone]   Eligibility Requirements You must have lived in [place], [place], or [place] counties for at least the last three months.   You cannot be eligible for state or federal sponsored [place], including [employer], [place], or [employer].   You generally cannot be eligible for healthcare insurance through your employer.    How to  apply: Eligibility screenings are held every Tuesday and Wednesday afternoon from 1:00 pm until 4:00 pm. You do not need an appointment for the interview!  [facility] [address], [place], [place] [phone]   [facility] Health [facility]  [phone]   [facility] Health Department  [phone]   [facility]  [phone]    Behavioral Health Resources in the Community: Intensive Outpatient Programs  Organization         Address  Phone  Notes  [facility] [address], [place], [place] [phone]   [facility] [address], [place], [place] [phone]   [facility] [address], [place], [place]  [phone]   [facility] [address],  [place], [place] [phone] or [phone]   Substance Abuse Resources Organization         Address  Phone  Notes  [facility]  [phone]   [facility]  [phone]   [facility]  [phone]   [facility]  [phone]   Residential & Outpatient Substance Abuse Program  [phone]   Psychological Services Organization         Address  Phone  Notes  [facility]  [phone]- [phone]   [facility]  [phone]- [phone]   [facility] [address], [place] [phone] or [phone]    Mobile Crisis Teams Organization         Address  Phone  Notes  [facility], Mobile Crisis Care Unit  [phone]   [facility]  [address] [place], [place] [phone]   Doristine LocksSharon DeEsch [address] [place] [phone]    Self-Help/Support Groups Organization         Address  Phone             Notes  [facility] [place] - variety of support groups  [phone]- [phone] Call for more information  Narcotics Anonymous (NA), [facility] [address], [place]  2 meetings at this location   Statisticianesidential Treatment Programs Organization         Address  Phone  Notes  [facility] [address],    [place]  [phone]   [facility]  [address], [place] [phone], [place], [place] [phone]   [facility] [address], [place] [phone] Admissions: 8am-3pm M-F  [facility] [address]    [place], [place] [phone]   [facility] [address], [place], [place] [phone]   [facility] [address]  [place], [place] [phone]   [facility] - Intensive Outpatient [address], [place], [place] [phone]   [facility] ([facility] [address]  [place], [place] [phone] or [phone]   [facility] ([facility]) [address] [place], [place] [phone] Accepts Medicaid  [facility] [address]  [place] [phone] Substance Abuse/Addiction Treatment   [facility] Resources Organization         Address  Phone  Notes  [facility]  [phone]   Angie FavaJulie Brannon, PhD [address], [address] [place], [place]   [phone] or [phone]) [phone]   [facility]   [address] [place], [place] [phone]   [facility] [address], [place], [place] [phone]) [phone] Insurance/Medicaid/sponsorship through [facility]  [facility] [address]                                    [place], [place] [phone]) [phone] Therapy/tele-psych/case  [facility] [address]   [place], [place] [phone]    Dr. Lolly MustacheArfeen  (  [phone]) [phone]   [facility]  [facility]. 1) [address] [place] 2) [address], [place] 3)  [address], [place] [phone]  [phone]   [facility] Child Abuse Hotline  ([phone] or [phone] (After Hours)

## 2015-01-14 LAB — CULTURE, GROUP A STREP

## 2016-03-23 ENCOUNTER — Encounter (HOSPITAL_COMMUNITY): Payer: Self-pay | Admitting: *Deleted

## 2016-03-23 ENCOUNTER — Emergency Department (HOSPITAL_COMMUNITY): Payer: Self-pay

## 2016-03-23 ENCOUNTER — Emergency Department (HOSPITAL_COMMUNITY)
Admission: EM | Admit: 2016-03-23 | Discharge: 2016-03-23 | Disposition: A | Discharge status: Home / Self Care | Payer: Self-pay | Attending: Emergency Medicine | Admitting: Emergency Medicine

## 2016-03-23 DIAGNOSIS — K5732 Diverticulitis of large intestine without perforation or abscess without bleeding: Secondary | ICD-10-CM | POA: Insufficient documentation

## 2016-03-23 DIAGNOSIS — R1032 Left lower quadrant pain: Secondary | ICD-10-CM

## 2016-03-23 LAB — URINALYSIS, ROUTINE W REFLEX MICROSCOPIC
Bacteria, UA: NONE SEEN
Bilirubin Urine: NEGATIVE
Glucose, UA: NEGATIVE mg/dL
KETONES UR: NEGATIVE mg/dL
Nitrite: NEGATIVE
Protein, ur: 100 mg/dL — AB
Specific Gravity, Urine: 1.028 (ref 1.005–1.030)
pH: 5 (ref 5.0–8.0)

## 2016-03-23 LAB — CBC
HEMATOCRIT: 44.2 % (ref 36.0–46.0)
HEMOGLOBIN: 14.5 g/dL (ref 12.0–15.0)
MCH: 29.5 pg (ref 26.0–34.0)
MCHC: 32.8 g/dL (ref 30.0–36.0)
MCV: 89.8 fL (ref 78.0–100.0)
PLATELETS: 309 10*3/uL (ref 150–400)
RBC: 4.92 MIL/uL (ref 3.87–5.11)
RDW: 13.8 % (ref 11.5–15.5)
WBC: 14.6 10*3/uL — ABNORMAL HIGH (ref 4.0–10.5)

## 2016-03-23 LAB — I-STAT BETA HCG BLOOD, ED (MC, WL, AP ONLY): I-stat hCG, quantitative: <5 m[IU]/mL (ref <5)

## 2016-03-23 LAB — COMPREHENSIVE METABOLIC PANEL
ALT: 32 U/L (ref 14–54)
ANION GAP: 9 (ref 5–15)
AST: 23 U/L (ref 15–41)
Albumin: 3.8 g/dL (ref 3.5–5.0)
Alkaline Phosphatase: 102 U/L (ref 38–126)
BUN: 8 mg/dL (ref 6–20)
CO2: 25 mmol/L (ref 22–32)
Calcium: 9.2 mg/dL (ref 8.9–10.3)
Chloride: 103 mmol/L (ref 101–111)
Creatinine, Ser: 0.64 mg/dL (ref 0.44–1.00)
GFR calc non Af Amer: >60 mL/min (ref >60)
Glucose, Bld: 125 mg/dL — ABNORMAL HIGH (ref 65–99)
POTASSIUM: 3.4 mmol/L — AB (ref 3.5–5.1)
Sodium: 137 mmol/L (ref 135–145)
Total Bilirubin: 0.8 mg/dL (ref 0.3–1.2)
Total Protein: 7.6 g/dL (ref 6.5–8.1)

## 2016-03-23 LAB — LIPASE, BLOOD: Lipase: 34 U/L (ref 11–51)

## 2016-03-23 MED ORDER — CIPROFLOXACIN HCL 500 MG PO TABS
500.0000 mg | ORAL_TABLET | Freq: Once | ORAL | Status: AC
  Administered 2016-03-23: 500 mg via ORAL
  Filled 2016-03-23: qty 1

## 2016-03-23 MED ORDER — ONDANSETRON HCL 4 MG PO TABS: 4.0000 mg | ORAL_TABLET | Freq: Four times a day (QID) | ORAL | 0 refills | Status: DC

## 2016-03-23 MED ORDER — DEXTROSE 5 % IV SOLN
1.0000 g | Freq: Once | INTRAVENOUS | Status: AC
  Administered 2016-03-23: 1 g via INTRAVENOUS
  Filled 2016-03-23: qty 10

## 2016-03-23 MED ORDER — CIPROFLOXACIN HCL 500 MG PO TABS: 500.0000 mg | ORAL_TABLET | Freq: Two times a day (BID) | ORAL | 0 refills | Status: AC

## 2016-03-23 MED ORDER — IOPAMIDOL (ISOVUE-300) INJECTION 61%
INTRAVENOUS | Status: AC
  Administered 2016-03-23: 100 mL
  Filled 2016-03-23: qty 100

## 2016-03-23 MED ORDER — OXYCODONE-ACETAMINOPHEN 5-325 MG PO TABS: 2.0000 | ORAL_TABLET | ORAL | 0 refills | Status: DC | PRN

## 2016-03-23 MED ORDER — SODIUM CHLORIDE 0.9 % IV BOLUS (SEPSIS)
1000.0000 mL | Freq: Once | INTRAVENOUS | Status: AC
  Administered 2016-03-23: 1000 mL via INTRAVENOUS

## 2016-03-23 MED ORDER — METRONIDAZOLE 500 MG PO TABS: 500.0000 mg | ORAL_TABLET | Freq: Two times a day (BID) | ORAL | 0 refills | Status: DC

## 2016-03-23 MED ORDER — KETOROLAC TROMETHAMINE 30 MG/ML IJ SOLN
30.0000 mg | Freq: Once | INTRAMUSCULAR | Status: AC
  Administered 2016-03-23: 30 mg via INTRAVENOUS
  Filled 2016-03-23: qty 1

## 2016-03-23 MED ORDER — METRONIDAZOLE 500 MG PO TABS
500.0000 mg | ORAL_TABLET | Freq: Once | ORAL | Status: AC
  Administered 2016-03-23: 500 mg via ORAL
  Filled 2016-03-23: qty 1

## 2016-03-23 NOTE — ED Triage Notes (Signed)
Pt reports lower abd pain with nausea and fever. Denies urinary or vaginal symptoms.

## 2016-03-23 NOTE — Discharge Instructions (Signed)
We believe your symptoms are caused by diverticulitis.  Most of the time this condition (please read through the included information) can be cured with outpatient antibiotics.  Please take the full course of prescribed medication(s) and follow up with the doctors recommended above.  Return to the ED if your abdominal pain worsens or fails to improve, you develop bloody vomiting, bloody diarrhea, you are unable to tolerate fluids due to vomiting, fever greater than 101, or other symptoms that concern you.  Take Oxycodone as prescribed for severe pain. Do not drink alcohol, drive or participate in any other potentially dangerous activities while taking this medication as it may make you sleepy. Do not take this medication with any other sedating medications, either prescription or over-the-counter. If you were prescribed Percocet or Vicodin, do not take these with acetaminophen (Tylenol) as it is already contained within these medications.   This medication is an opiate (or narcotic) pain medication and can be habit forming.  Use it as little as possible to achieve adequate pain control.  Do not use or use it with extreme caution if you have a history of opiate abuse or dependence.  If you are on a pain contract with your primary care doctor or a pain specialist, be sure to let them know you were prescribed this medication today from the [facility].  This medication is intended for your use only - do not give any to anyone else and keep it in a secure place where nobody else, especially children, have access to it.  It will also cause or worsen constipation, so you may want to consider taking an over-the-counter stool softener while you are taking this medication.   Diverticulitis Diverticulitis is inflammation or infection of small pouches in your colon that form when you have a condition called diverticulosis. The pouches in your colon are called diverticula. Your colon, or  large intestine, is where water is absorbed and stool is formed. Complications of diverticulitis can include: Bleeding. Severe infection. Severe pain. Perforation of your colon. Obstruction of your colon. CAUSES  Diverticulitis is caused by bacteria. Diverticulitis happens when stool becomes trapped in diverticula. This allows bacteria to grow in the diverticula, which can lead to inflammation and infection. RISK FACTORS People with diverticulosis are at risk for diverticulitis. Eating a diet that does not include enough fiber from fruits and vegetables may make diverticulitis more likely to develop. SYMPTOMS  Symptoms of diverticulitis may include: Abdominal pain and tenderness. The pain is normally located on the left side of the abdomen, but may occur in other areas. Fever and chills. Bloating. Cramping. Nausea. Vomiting. Constipation. Diarrhea. Blood in your stool. DIAGNOSIS  Your health care provider will ask you about your medical history and do a physical exam. You may need to have tests done because many medical conditions can cause the same symptoms as diverticulitis. Tests may include: Blood tests. Urine tests. Imaging tests of the abdomen, including X-rays and CT scans. When your condition is under control, your health care provider may recommend that you have a colonoscopy. A colonoscopy can show how severe your diverticula are and whether something else is causing your symptoms. TREATMENT  Most cases of diverticulitis are mild and can be treated at home. Treatment may include: Taking over-the-counter pain medicines. Following a clear liquid diet. Taking antibiotic medicines by mouth for 7-10 days. More severe cases may be treated at a hospital. Treatment may include: Not eating or drinking. Taking prescription pain medicine. Receiving antibiotic  cases may be treated at a hospital. Treatment may include: °· Not eating or drinking. °· Taking prescription pain medicine. °· Receiving antibiotic medicines through an IV tube. °· Receiving fluids and nutrition through an IV  tube. °· Surgery. °HOME CARE INSTRUCTIONS  °· Follow your health care provider's instructions carefully. °· Follow a full liquid diet or other diet as directed by your health care provider. After your symptoms improve, your health care provider may tell you to change your diet. He or she may recommend you eat a high-fiber diet. Fruits and vegetables are good sources of fiber. Fiber makes it easier to pass stool. °· Take fiber supplements or probiotics as directed by your health care provider. °· Only take medicines as directed by your health care provider. °· Keep all your follow-up appointments. °SEEK MEDICAL CARE IF:  °· Your pain does not improve. °· You have a hard time eating food. °· Your bowel movements do not return to normal. °SEEK IMMEDIATE MEDICAL CARE IF:  °· Your pain becomes worse. °· Your symptoms do not get better. °· Your symptoms suddenly get worse. °· You have a fever. °· You have repeated vomiting. °· You have bloody or black, tarry stools. °MAKE SURE YOU:  °· Understand these instructions. °· Will watch your condition. °· Will get help right away if you are not doing well or get worse. °Document Released: 11/10/2004 Document Revised: 02/05/2013 Document Reviewed: 12/26/2012 °ExitCare® Patient Information ©2015 [employer]. This information is not intended to replace advice given to you by your health care provider. Make sure you discuss any questions you have with your health care provider. ° °

## 2016-03-23 NOTE — ED Provider Notes (Signed)
Emergency Department Provider Note   I have reviewed the triage vital signs and the nursing notes.   HISTORY  Chief Complaint Abdominal Pain   HPI Andrea Villegas is a 56 y.o. female with PMH of ovarian cysts presents to the emergency department for evaluation of lower abdominal pain with associated nausea and fever. She denies any signs or symptoms of urinary tract infection. Denies vaginal bleeding or discharge. She does note some occasional blood per rectum over the past 2 months. He does not worsen significantly. Her lower abdominal pain started 2 days ago and has been progressively worse. She describes it as a crampy pain that is nonradiating and worse to the left side. No modifying factors. No new medications or recent surgery. She describes it as moderate to severe pain. The timing is constant with occasional worsening of symptoms.   Past Medical History:  Diagnosis Date  . Lung nodules   . Medical history non-contributory     Patient Active Problem List   Diagnosis Date Noted  . Right ovarian cyst 07/16/2014  . Influenza A   . Shortness of breath   . Pyrexia   . Viral illness   . Hypoxia 04/09/2014  . Acute respiratory failure with hypoxia (HCC) 04/09/2014  . Elevated LFTs 04/09/2014  . SIRS (systemic inflammatory response syndrome) (HCC) 04/09/2014    Past Surgical History:  Procedure Laterality Date  . CHOLECYSTECTOMY    . TUBAL LIGATION      Current Outpatient Rx  . Order #: [id number] Class: Print  . Order #: [id number] Class: Print  . Order #: [id number] Class: Historical Med  . Order #: [id number] Class: Print  . Order #: [id number] Class: Print  . Order #: [id number] Class: Print  . Order #: [id number] Class: Print  . Order #: [id number] Class: Print  . Order #: [id number] Class: Print  . Order #: [id number] Class: Print  . Order #: [id number] Class: Historical Med  . Order #: [id number] Class: Print  . Order #: [id number] Class: Print  . Order #: [id number] Class: Print    . Order #: [id number] Class: Print  . Order #: [id number] Class: Print  . Order #: [id number] Class: Historical Med    Allergies Patient has no known allergies.  Family History  Problem Relation Age of Onset  . Diabetes Mellitus II Father   . CAD Father     Social History Social History  Substance Use Topics  . Smoking status: Never Smoker  . Smokeless tobacco: Not on file  . Alcohol use No    Review of Systems  Constitutional: Positive fever/chills Eyes: No visual changes. ENT: No sore throat. Cardiovascular: Denies chest pain. Respiratory: Denies shortness of breath. Gastrointestinal: Positive lower abdominal pain. Positive nausea, no vomiting.  No diarrhea.  No constipation. Genitourinary: Negative for dysuria. No vaginal bleeding or discharge.  Musculoskeletal: Negative for back pain. Skin: Negative for rash. Neurological: Negative for headaches, focal weakness or numbness.  10-point ROS otherwise negative.  ____________________________________________   PHYSICAL EXAM:  VITAL SIGNS: ED Triage Vitals  Enc Vitals Group     BP 03/23/16 1728 108/76     Pulse Rate 03/23/16 1728 110     Resp 03/23/16 1728 18     Temp 03/23/16 1728 99.5 F (37.5 C)     Temp Source 03/23/16 1728 Oral     SpO2 03/23/16 1728 97 %     Pain Score 03/23/16 1727 9   Constitutional: Alert and oriented. Well appearing and in no acute distress. Eyes: Conjunctivae are normal.  Head: Atraumatic. Nose: No congestion/rhinnorhea. Mouth/Throat: Mucous  membranes are moist.  Oropharynx non-erythematous. Neck: No stridor.  Cardiovascular: Normal rate, regular rhythm. Good peripheral circulation. Grossly normal heart sounds.   Respiratory: Normal respiratory effort.  No retractions. Lungs CTAB. Gastrointestinal: Soft with positive LLQ tenderness. No rebound or guarding. No distention.  Musculoskeletal: No lower extremity tenderness nor edema. No gross deformities of extremities. Neurologic:   Normal speech and language. No gross focal neurologic deficits are appreciated.  Skin:  Skin is warm, dry and intact. No rash noted. Psychiatric: Mood and affect are normal. Speech and behavior are normal.  ____________________________________________   LABS (all labs ordered are listed, but only abnormal results are displayed)  Labs Reviewed  COMPREHENSIVE METABOLIC PANEL - Abnormal; Notable for the following:       Result Value   Potassium 3.4 (*)    Glucose, Bld 125 (*)    All other components within normal limits  CBC - Abnormal; Notable for the following:    WBC 14.6 (*)    All other components within normal limits  URINALYSIS, ROUTINE W REFLEX MICROSCOPIC - Abnormal; Notable for the following:    APPearance HAZY (*)    Hgb urine dipstick LARGE (*)    Protein, ur 100 (*)    Leukocytes, UA LARGE (*)    Squamous Epithelial / LPF 0-5 (*)    All other components within normal limits  LIPASE, BLOOD  I-STAT BETA HCG BLOOD, ED ([facility], [facility], [facility] ONLY)   ____________________________________________  RADIOLOGY  Ct Abdomen Pelvis W Contrast  Result Date: 03/23/2016 CLINICAL DATA:  Left lower quadrant pain and nausea starting this morning. Rule out diverticulitis. EXAM: CT ABDOMEN AND PELVIS WITH CONTRAST TECHNIQUE: Multidetector CT imaging of the abdomen and pelvis was performed using the standard protocol following bolus administration of intravenous contrast. CONTRAST:  ISOVUE-300 IOPAMIDOL (ISOVUE-300) INJECTION 61% COMPARISON:  04/09/2014 FINDINGS: Lower chest:  No contributory findings. Hepatobiliary: Prominent hepatic steatosis. Small perfusion anomaly seen in the subcapsular inferior right liver.Cholecystectomy. Negative common bile duct. Pancreas: Unremarkable. Spleen: Unremarkable. Adrenals/Urinary Tract: Negative adrenals. No hydronephrosis or stone. Unremarkable bladder. Stomach/Bowel: Focal inflammation around a thickened sigmoid diverticulum consistent with acute  diverticulitis. No abscess or free perforation. No bowel obstruction. Negative appendix. There is generalized prominent enhancement of the gastric mucosa which may be within normal limits - no focal mass or ulceration noted. Prominence of the greater omental vessels is stable. Vascular/Lymphatic: No acute vascular abnormality. Mild atherosclerotic calcification. No mass or adenopathy. Reproductive:Negative Other: No ascites or pneumoperitoneum. Musculoskeletal: No acute abnormalities. IMPRESSION: 1. Sigmoid diverticulitis without abscess or free perforation. 2. Hepatic steatosis. Electronically Signed   By: Marnee Spring M.D.   On: 03/23/2016 20:58    ____________________________________________   PROCEDURES  Procedure(s) performed:   Procedures  None ____________________________________________   INITIAL IMPRESSION / ASSESSMENT AND PLAN / ED COURSE  Pertinent labs & imaging results that were available during my care of the patient were reviewed by me and considered in my medical decision making (see chart for details).  Patient resents to the emergency department for evaluation of lower abdominal pain with associated nausea and fever. She is afebrile here that is slightly tachycardic. She has focal tenderness in the left lower quadrant. No vaginal bleeding or discharge. No dysuria. Patient with urinary tract infection on urinalysis. Plan for IV fluids, Rocephin, CT scan given her focal lower abdominal tenderness to evaluate for possible diverticulitis.   09:25 PM Patient with acute diverticulitis in the sigmoid colon without perforation or abscess. This matches the patient's  presentation clinically. She has not had vomiting in the emergency department. She feels better and would like to return home and attempt outpatient mgmt which seems reasonable. Discussed return precautions in detail and provided follow up call information regarding establishing care with PCP.   At this time, I do  not feel there is any life-threatening condition present. I have reviewed and discussed all results (EKG, imaging, lab, urine as appropriate), exam findings with patient. I have reviewed nursing notes and appropriate previous records.  I feel the patient is safe to be discharged home without further emergent workup. Discussed usual and customary return precautions. Patient and family (if present) verbalize understanding and are comfortable with this plan.  Patient will follow-up with their primary care provider. If they do not have a primary care provider, information for follow-up has been provided to them. All questions have been answered.  ____________________________________________  FINAL CLINICAL IMPRESSION(S) / ED DIAGNOSES  Final diagnoses:  Left lower quadrant pain  Diverticulitis of large intestine without perforation or abscess without bleeding     MEDICATIONS GIVEN DURING THIS VISIT:  Medications  ciprofloxacin (CIPRO) tablet 500 mg (not administered)  metroNIDAZOLE (FLAGYL) tablet 500 mg (not administered)  sodium chloride 0.9 % bolus 1,000 mL (1,000 mLs Intravenous New Bag/Given 03/23/16 1946)  cefTRIAXone (ROCEPHIN) 1 g in dextrose 5 % 50 mL IVPB (0 g Intravenous Stopped 03/23/16 2016)  ketorolac (TORADOL) 30 MG/ML injection 30 mg (30 mg Intravenous Given 03/23/16 1946)  iopamidol (ISOVUE-300) 61 % injection (100 mLs  Contrast Given 03/23/16 2034)     NEW OUTPATIENT MEDICATIONS STARTED DURING THIS VISIT:  New Prescriptions   CIPROFLOXACIN (CIPRO) 500 MG TABLET    Take 1 tablet (500 mg total) by mouth 2 (two) times daily.   METRONIDAZOLE (FLAGYL) 500 MG TABLET    Take 1 tablet (500 mg total) by mouth 2 (two) times daily.   ONDANSETRON (ZOFRAN) 4 MG TABLET    Take 1 tablet (4 mg total) by mouth every 6 (six) hours.   OXYCODONE-ACETAMINOPHEN (PERCOCET/ROXICET) 5-325 MG TABLET    Take 2 tablets by mouth every 4 (four) hours as needed for severe pain.      Note:  This document  was prepared using Dragon voice recognition software and may include unintentional dictation errors.  Alona BeneJoshua Ladarian Bonczek, MD Emergency Medicine   Maia PlanJoshua G Joany Khatib, MD 03/23/16 2136

## 2016-03-23 NOTE — ED Notes (Signed)
Pt instructed to get urine sample while waiting.

## 2017-06-11 ENCOUNTER — Emergency Department
Admission: EM | Admit: 2017-06-11 | Discharge: 2017-06-11 | Disposition: A | Discharge status: Home / Self Care | Payer: BC Managed Care – PPO | Attending: Nurse Practitioner | Admitting: Nurse Practitioner

## 2017-06-11 DIAGNOSIS — R509 Fever, unspecified: Secondary | ICD-10-CM | POA: Insufficient documentation

## 2017-06-11 DIAGNOSIS — R05 Cough: Secondary | ICD-10-CM | POA: Insufficient documentation

## 2017-06-11 DIAGNOSIS — R51 Headache: Secondary | ICD-10-CM

## 2017-06-11 DIAGNOSIS — B349 Viral infection, unspecified: Secondary | ICD-10-CM | POA: Insufficient documentation

## 2017-06-11 LAB — VH INFLUENZA A/B RAPID TEST
Influenza A: NEGATIVE
Influenza B: NEGATIVE

## 2017-06-11 MED ORDER — IBUPROFEN 600 MG PO TABS
ORAL_TABLET | ORAL | Status: AC
Start: 2017-06-11 — End: ?
  Filled 2017-06-11: qty 1

## 2017-06-11 MED ORDER — BENZONATATE 100 MG PO CAPS
100.00 mg | ORAL_CAPSULE | Freq: Three times a day (TID) | ORAL | 1 refills | Status: AC | PRN
Start: 2017-06-11 — End: 2018-06-11

## 2017-06-11 MED ORDER — PROMETHAZINE-DM 6.25-15 MG/5ML PO SYRP
5.00 mL | ORAL_SOLUTION | Freq: Four times a day (QID) | ORAL | 0 refills | Status: AC | PRN
Start: 2017-06-11 — End: ?

## 2017-06-11 MED ORDER — IBUPROFEN 600 MG PO TABS
600.00 mg | ORAL_TABLET | Freq: Once | ORAL | Status: AC
Start: 2017-06-11 — End: 2017-06-11
  Administered 2017-06-11: 14:00:00 600 mg via ORAL

## 2017-06-11 NOTE — ED Triage Notes (Signed)
Per patient: started two days ago with cough which is non productive. Dry cough. C/o headache. States at times she feels slightly disoriented. Pt states unknown if she has had a fever.

## 2017-06-11 NOTE — Discharge Instructions (Signed)
Viral Syndrome (Adult)  A viral illness may cause a number of symptoms such as fever. Other symptoms depend on the part of the body that the virus affects. If it settles in your nose, throat, and lungs, it may cause cough, sore throat, congestion, runny nose, headache, earache and other ear symptoms, or shortness of breath. If it settles in your stomach and intestinal tract, it may cause nausea, vomiting, cramping, and diarrhea. Sometimes it causes generalized symptoms like "aching all over," feeling tired, loss of energy, or loss of appetite.  A viral illness usually lasts anywhere from several days to several weeks, but sometimes it lasts longer. In some cases, a more serious infection can look like a viral syndrome in the first few days of the illness. You may need another exam and additional tests to know the difference. Watch for the warning signs listed below for when to seek medical advice.  Home care  Follow these guidelines for taking care of yourself at home:   If symptoms are severe, rest at home for the first 2 to 3 days.   Stay away from cigarette smoke - both your smoke and the smoke from others.   You may use over-the-counteracetaminophen or ibuprofen for fever, muscle aching, and headache, unless another medicine was prescribed for this. If you have chronic liver or kidney disease or ever had a stomach ulcer or gastrointestinal bleeding, talk with your healthcare provider before using these medicines. No one who is younger than 18 and ill with a fever should take aspirin. It may cause severe disease or death.   Your appetite may be poor, so a light diet is fine. Avoid dehydration by drinking 8 to 12, 8-ounce glasses of fluids each day. This may include water; orange juice; lemonade; apple, grape, and cranberry juice; clear fruit drinks; electrolyte replacement and sports drinks; and decaffeinated teas and coffee. If you have been diagnosed with a kidney disease, ask your healthcare provider  how much and what types of fluids you should drink to prevent dehydration. If you have kidney disease, drinking too much fluid can cause it build up in the your body and be dangerous to your health.   Over-the-counter remedies won't shorten the length of the illness but may be helpful for symptoms such as cough, sore throat, nasal and sinus congestion, or diarrhea. Don't use decongestants if you have high blood pressure.  Follow-up care  Follow up with your healthcare provider if you do not improve over the next week.  Call 911  Call 911 if any of the following occur:   Convulsion   Feeling weak, dizzy, or like you are going to faint   Chest pain, or more than mild shortness of breath  When to seek medical advice  Call your healthcare provider right away if any of these occur:   Cough with lots of colored sputum (mucus) or blood in your sputum   Chest pain, shortness of breath, wheezing, or trouble breathing   Severe headache; face, neck, or ear pain   Severe, constant pain in the lower right side of your belly (abdominal)   Continued vomiting (can't keep liquids down)   Frequent diarrhea (more than 5 times a day); blood (red or black color) or mucus in diarrhea   Feeling weak, dizzy, or like you are going to faint   Extreme thirst   Fever of 100.4F (38C) or higher, or as directed by your healthcare provider  Date Last Reviewed: 05/15/2016   2000-2018 The   [employer]. [address], [place], [place] [address]. All rights reserved. This information is not intended as a substitute for professional medical care. Always follow your healthcare professional's instructions.      -------------------  Discharge Instructions Include:    1.  Negative for influenza.    2.  For viral illness and cough/fever, tylenol and motrin, rotate every 3 hours.    3.  Tessalon perle tabs for daytime cough.    4.  Promethazine DM cough syrup for night time cough, will make you sleepy.    5.  If this persists for an  additional 5 days, or if symptoms worsen.  Return for evaluation.

## 2017-06-11 NOTE — ED Notes (Signed)
Pt resting on stretcher. Visitor at bedside.

## 2017-06-11 NOTE — ED Provider Notes (Signed)
[facility]  EMERGENCY DEPARTMENT  History and Physical Exam       Patient Name: JAMONICA, SCHOFF  Encounter Date:  06/11/2017  Nurse Practitioner: Graciela Husbands, FNP  Attending Physician: Justice Britain, *  PCP: Christa See, MD  Patient DOB:  09-17-1960  MRN:  [id number]  Room:  E8/ED8-A      History of Presenting Illness     Chief complaint: Cough    HPI/ROS given by: Patient    Zarria Towell is a 57 y.o. female who presents to the ED with two days of dry cough, fever and chills.  She also reports headache.  Denies shortness of breath, nausea, vomiting, diarrhea, smoking.  Reports that other family members in the house have had flu recently.  She denies sinus pressure, sore throat, postnasal drip, otalgia, visual disturbance.  Denies taking any home medications.    Reports a headache, throbbing all over, rated 9/10.      Review of Systems     Review of Systems   Constitutional: Positive for chills and fever. Negative for activity change, appetite change and fatigue.   HENT: Negative for congestion, ear pain, postnasal drip, sinus pain, sinus pressure, sore throat, trouble swallowing and voice change.    Eyes: Negative for photophobia and visual disturbance.   Respiratory: Positive for cough. Negative for shortness of breath, wheezing and stridor.    Cardiovascular: Negative for chest pain.   Gastrointestinal: Negative for abdominal pain, constipation, diarrhea, nausea and vomiting.   Genitourinary: Negative for decreased urine volume, dysuria, flank pain and urgency.   Musculoskeletal: Positive for myalgias.   Skin: Negative for pallor, rash and wound.   Neurological: Positive for headaches. Negative for dizziness, seizures, syncope, speech difficulty, weakness, light-headedness and numbness.        Allergies & Medications     Pt has No Known Allergies.    Current/Home Medications    No medications on file        Past Medical History     Pt has a past medical history of Diabetes  mellitus.     Past Surgical History     Pt  has no past surgical history on file.     Family History     The family history is not on file.     Social History     Pt reports that she has quit smoking. She has never used smokeless tobacco. She reports that she does not drink alcohol or use drugs.     Physical Exam     Vitals:    06/11/17 1334 06/11/17 1438   BP: 129/74 120/76   Pulse: (!) 103 98   Resp: (!) 24 20   Temp: 99.8 F (37.7 C)    TempSrc: Oral    SpO2: 93% 92%   Weight: 77.1 kg    Height: 1.524 m          Physical Exam   Constitutional: She is oriented to person, place, and time. She appears well-developed and well-nourished. No distress.   HENT:   Head: Normocephalic and atraumatic.   Mouth/Throat: Oropharynx is clear and moist. No oropharyngeal exudate.   Eyes: Pupils are equal, round, and reactive to light.   Neck: Normal range of motion. Neck supple.   Cardiovascular: Normal rate, regular rhythm, normal heart sounds and intact distal pulses.    No murmur heard.  Pulmonary/Chest: Effort normal and breath sounds normal. No respiratory distress. She has no wheezes. She has no rales.  Abdominal: Soft.   Musculoskeletal: Normal range of motion. She exhibits no edema.   Neurological: She is alert and oriented to person, place, and time. No cranial nerve deficit.   Skin: Skin is warm and dry. No rash noted. She is not diaphoretic. No erythema. No pallor.   Psychiatric: She has a normal mood and affect. Her behavior is normal. Thought content normal.   Nursing note and vitals reviewed.         Diagnostic Results     The results of the diagnostic studies below have been reviewed by myself:    Labs  Results     Procedure Component Value Units Date/Time    Influenza A / B Rapid Test [[id number]] Collected:  06/11/17 1410    Specimen:  Nasal Wash Updated:  06/11/17 1443     Influenza A Negative     Influenza B Negative    Narrative:       Influenza A antigen detection tests are unable to distinquish between novel  and seasonal influenza A.    A negative result for either Influenza A or B antigen does not exclude influenza virus infection. Clinical correlation required.    All positive influenza antigen tests (A or B) require placement of patient on droplet precaution isolation.          Radiologic Studies  No results found.           ED Course and Medical Decision Making     ED Medication Orders     Start Ordered     Status Ordering Provider    06/11/17 1410 06/11/17 1409  ibuprofen (ADVIL,MOTRIN) tablet 600 mg  Once in ED     Route: Oral  Ordered Dose: 600 mg     Last MAR action:  Given Mahalia Dykes P             The patient's presentation is suggestive of a viral syndrome. The patient is clinically well appearing. Bacterial or other serious etiologies like strep throat, meningitis, lyme disease, pneumonia were considered but are felt unlikely based upon the history and exam. There is no history of immune compromise. There are no meningeal or sepsis indicators. The patient appears stable for discharge and fever precautions were given. The patient was encouraged to follow-up with their primary care physician as needed and for review of pending labs if instructed to do so.  Diagnostic impression and plan were discussed with the patient and/or family.  Results of lab/radiology tests were reviewed and discussed with the patient and/or family. All questions were answered and concerns addressed. The patient appears appropriate for outpatient management with symptomatic treatment and primary care physician follow-up.  Warned to return immediately for worsening symptoms or any concerns.    -------------------  Discharge Instructions Include:    1.  Negative for influenza.    2.  For viral illness and cough/fever, tylenol and motrin, rotate every 3 hours.    3.  Tessalon perle tabs for daytime cough.    4.  Promethazine DM cough syrup for night time cough, will make you sleepy.    5.  If this persists for an additional 5 days, or  if symptoms worsen.  Return for evaluation.       In addition to the above history, please see nursing notes. Allergies, meds, past medical, family, social hx, and the results of the diagnostic studies performed have been reviewed by myself.    I discussed this case with the attending physician  in the emergency department and they agree with the assessment and treatment plan.     This chart was generated by an EMR and may contain errors or omissions not intended by the user.     Procedures / Critical Care     None     Diagnosis / Disposition     Clinical Impression  1. Viral illness        Disposition  ED Disposition     ED Disposition Condition Date/Time Comment    Discharge  Sun Jun 11, 2017  2:59 PM Renea Ee Rosser discharge to home/self care.    Condition at disposition: Stable            Follow up for Discharged Patients  [facility] Emergency Department  [address]  [place] [address]  [phone]    As needed, If symptoms worsen      Prescriptions for Discharged Patients  New Prescriptions    BENZONATATE (TESSALON PERLES) 100 MG CAPSULE    Take 1 capsule (100 mg total) by mouth 3 (three) times daily as needed for Cough    PROMETHAZINE-DEXTROMETHORPHAN (PROMETHAZINE-DM) 6.25-15 MG/5ML SYRUP    Take 5 mLs by mouth 4 (four) times daily as needed for Cough               Ulice Dash, FNP  06/11/17 1501

## 2017-10-21 ENCOUNTER — Emergency Department (HOSPITAL_COMMUNITY): Payer: No Typology Code available for payment source

## 2017-10-21 ENCOUNTER — Emergency Department (HOSPITAL_COMMUNITY)
Admission: EM | Admit: 2017-10-21 | Discharge: 2017-10-21 | Disposition: A | Discharge status: Home / Self Care | Payer: No Typology Code available for payment source | Attending: Emergency Medicine | Admitting: Emergency Medicine

## 2017-10-21 ENCOUNTER — Other Ambulatory Visit: Payer: Self-pay

## 2017-10-21 ENCOUNTER — Encounter (HOSPITAL_COMMUNITY): Payer: Self-pay

## 2017-10-21 DIAGNOSIS — R202 Paresthesia of skin: Secondary | ICD-10-CM | POA: Diagnosis not present

## 2017-10-21 DIAGNOSIS — Z79899 Other long term (current) drug therapy: Secondary | ICD-10-CM | POA: Diagnosis not present

## 2017-10-21 DIAGNOSIS — M79641 Pain in right hand: Secondary | ICD-10-CM

## 2017-10-21 DIAGNOSIS — M25531 Pain in right wrist: Secondary | ICD-10-CM

## 2017-10-21 MED ORDER — IBUPROFEN 400 MG PO TABS
600.0000 mg | ORAL_TABLET | Freq: Once | ORAL | Status: AC
  Administered 2017-10-21: 600 mg via ORAL
  Filled 2017-10-21: qty 1

## 2017-10-21 MED ORDER — ACETAMINOPHEN 500 MG PO TABS: 1000.0000 mg | ORAL_TABLET | Freq: Three times a day (TID) | ORAL | 0 refills | Status: DC | PRN

## 2017-10-21 MED ORDER — IBUPROFEN 600 MG PO TABS: 600.0000 mg | ORAL_TABLET | Freq: Four times a day (QID) | ORAL | 0 refills | Status: DC | PRN

## 2017-10-21 NOTE — Discharge Instructions (Signed)
Thank you for allowing me to care for you today in the Emergency Department.   Wear the wrist brace as frequently as possible to help your pain. Try to sleep with it every night if you are unable to wear it at work.  Call Dr. Debby Bud office to schedule a follow-up appointment when they reopen on Monday.  You can take Tylenol and ibuprofen to help with your pain.  Return to the emergency department if you develop any new injury to the right wrist or hand, if the area gets red, hot, swollen to the touch, if fingertips turn blue, or if you develop other new, concerning symptoms.

## 2017-10-21 NOTE — ED Notes (Signed)
Patient transported to X-ray 

## 2017-10-21 NOTE — ED Notes (Signed)
ED Provider at bedside. 

## 2017-10-21 NOTE — ED Triage Notes (Signed)
Pt presents for evaluation of R hand pain. Pt reports she works in [employer] and uses hands all day long. States has had pain intermittently before but with self resolution. States pain has been very bad since Wednesday, unable to sleep.

## 2017-10-21 NOTE — ED Notes (Signed)
Pt verbalizes understanding of d/c instructions. Prescriptions reviewed with patient. Pt ambulatory at d/c with all belongings.  

## 2017-10-21 NOTE — ED Provider Notes (Signed)
[facility] EMERGENCY DEPARTMENT Provider Note   CSN: [id number] Arrival date & time: 10/21/17  1302     History   Chief Complaint Chief Complaint  Patient presents with  . Hand Pain    HPI Andrea Villegas is a 57 y.o. female with a history of lung nodules who presents to the emergency department with a chief complaint of worsening right wrist and hand pain.  She characterizes the pain as throbbing.  She reports the current episode of pain began 5 days ago, but significantly worsened 4 days ago.  She states that she has had a difficult time sleeping the last few nights due to the pain.  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  She reports that she makes approximately 150s to 180s which is per day.  She states that in order to make a switch that she has to use a drill, which she always uses in her right hand and then uses significant pressure to push the switch into place.  She is right-hand dominant.  She reports associated tingling in digits 1 through 3.  She states that since the pain is worse than that she has barely been able to make a fist.  She states that the hand of the felt weak, but the pain is more intense when she tries to make a fist.  She denies fever, chills, right elbow or shoulder pain.  No recent falls, injuries, wounds.  The history is provided by the patient. No language interpreter was used.    Past Medical History:  Diagnosis Date  . Lung nodules   . Medical history non-contributory     Patient Active Problem List   Diagnosis Date Noted  . Right ovarian cyst 07/16/2014  . Influenza A   . Shortness of breath   . Pyrexia   . Viral illness   . Hypoxia 04/09/2014  . Acute respiratory failure with hypoxia (HCC) 04/09/2014  . Elevated LFTs 04/09/2014  . SIRS (systemic inflammatory response syndrome) (HCC) 04/09/2014    Past Surgical History:  Procedure Laterality Date  . CHOLECYSTECTOMY    .  TUBAL LIGATION       OB History   None      Home Medications    Prior to Admission medications   Medication Sig Start Date End Date Taking? Authorizing Provider  acetaminophen (TYLENOL) 500 MG tablet Take 2 tablets (1,000 mg total) by mouth every 8 (eight) hours as needed. 10/21/17   Reshad Saab A, PA-C  Biotin 1000 MCG tablet Take 1,000 mcg by mouth daily.    [provider]  HYDROcodone-homatropine (HYCODAN) 5-1.5 MG/5ML syrup Take 5 mLs by mouth every 6 (six) hours as needed for cough. 01/10/15   Patel-Mills, Lorelle Formosa, PA-C  ibuprofen (ADVIL,MOTRIN) 600 MG tablet Take 1 tablet (600 mg total) by mouth every 6 (six) hours as needed. 10/21/17   Champion Corales A, PA-C  metroNIDAZOLE (FLAGYL) 500 MG tablet Take 1 tablet (500 mg total) by mouth 2 (two) times daily. 03/23/16   Long, Arlyss Repress, MD  Multiple Vitamin (MULTIVITAMIN WITH MINERALS) TABS Take 1 tablet by mouth daily.    [provider]  ondansetron (ZOFRAN) 4 MG tablet Take 1 tablet (4 mg total) by mouth every 6 (six) hours. 03/23/16   Long, Arlyss Repress, MD  oxyCODONE-acetaminophen (PERCOCET/ROXICET) 5-325 MG tablet Take 2 tablets by mouth every 4 (four) hours as needed for severe pain. 03/23/16   Long,  Arlyss Repress, MD  vitamin C (ASCORBIC ACID) 500 MG tablet Take 500 mg by mouth daily.    [provider]    Family History Family History  Problem Relation Age of Onset  . Diabetes Mellitus II Father   . CAD Father     Social History Social History   Tobacco Use  . Smoking status: Never Smoker  Substance Use Topics  . Alcohol use: No  . Drug use: No     Allergies   Patient has no known allergies.   Review of Systems Review of Systems  Constitutional: Negative for activity change, chills and fever.  Respiratory: Negative for shortness of breath.   Cardiovascular: Negative for chest pain.  Gastrointestinal: Negative for abdominal pain, diarrhea, nausea and vomiting.  Musculoskeletal: Positive for  arthralgias and myalgias. Negative for back pain.  Skin: Negative for rash.  Neurological: Positive for weakness and numbness. Negative for dizziness and syncope.   Physical Exam Updated Vital Signs BP (!) 142/88   Pulse 82   Temp 99.2 F (37.3 C) (Oral)   Resp 14   SpO2 96%   Physical Exam  Constitutional: No distress.  HENT:  Head: Normocephalic.  Eyes: Conjunctivae are normal.  Neck: Neck supple.  Cardiovascular: Normal rate and regular rhythm. Exam reveals no gallop and no friction rub.  No murmur heard. Pulmonary/Chest: Effort normal. No respiratory distress.  Abdominal: Soft. She exhibits no distension.  Musculoskeletal: She exhibits edema and tenderness. She exhibits no deformity.  Diffusely tender to palpation over the dorsum of the right hand.  No tenderness over the palmar surface.  She is also tender to palpation diffusely over the wrist, but snuffbox is nontender.  Radial pulses are 2+ and symmetric.  Sensation is intact and equal throughout the bilateral upper extremities. Strength against resistance of the digits is decreased on the right as compared to the left secondary to pain.  No cyanosis.  Capillary refill is less than 2.  Full active and passive range of motion of the shoulder, elbow, and wrist.  No overlying rash, erythema, or warmth.  Mild edema noted to the dorsum of the hands, proximal to digits 1 through 3.  Positive Phalen's test.  Unable to perform Lourena Simmonds the patient is unable to make a fist.  Neurological: She is alert.  Skin: Skin is warm. No rash noted.  Psychiatric: Her behavior is normal.  Nursing note and vitals reviewed.    ED Treatments / Results  Labs (all labs ordered are listed, but only abnormal results are displayed) Labs Reviewed - No data to display  EKG None  Radiology Dg Wrist Complete Right  Result Date: 10/21/2017 CLINICAL DATA:  Right hand pain. EXAM: RIGHT WRIST - COMPLETE 3+ VIEW COMPARISON:  None. FINDINGS: There is no  evidence of fracture or dislocation. There is no evidence of arthropathy or other focal bone abnormality. Soft tissues are unremarkable. IMPRESSION: Negative. Electronically Signed   By: Ted Mcalpine M.D.   On: 10/21/2017 15:44   Dg Hand Complete Right  Result Date: 10/21/2017 CLINICAL DATA:  Right hand pain. EXAM: RIGHT HAND - COMPLETE 3+ VIEW COMPARISON:  None. FINDINGS: There is no evidence of fracture or dislocation. There is no evidence of arthropathy or other focal bone abnormality. Soft tissues are unremarkable. IMPRESSION: Negative. Electronically Signed   By: Ted Mcalpine M.D.   On: 10/21/2017 15:44    Procedures Procedures (including critical care time)  Medications Ordered in ED Medications  ibuprofen (ADVIL,MOTRIN) tablet 600 mg (  600 mg Oral Given 10/21/17 1532)     Initial Impression / Assessment and Plan / ED Course  I have reviewed the triage vital signs and the nursing notes.  Pertinent labs & imaging results that were available during my care of the patient were reviewed by me and considered in my medical decision making (see chart for details).     56 year old female with a history of lung nodules presenting with acute on chronic right wrist and hand pain.  She is right-hand dominant and performs repetitive tasks approximately 150-180 times a day while she is at work.  Pain is worse at night.  Postive phalen's test.  She is neurovascularly intact on exam.  She appears uncomfortable.  No concern for infection, septic joint, gout.  X-ray of the right hand and wrist is negative.  Thousand milligrams of Tylenol given for pain control and the patient reports significant improvement in her pain on reevaluation.  Suspect carpal tunnel syndrome given repetitive occupational tasks.  Will place the patient in a wrist brace and have her follow-up with hand surgery.  Strict return with options given.  She is stable in no acute distress.  She is safe for discharge home with  outpatient follow-up at this time.  Final Clinical Impressions(s) / ED Diagnoses   Final diagnoses:  Acute pain of right wrist  Right hand pain    ED Discharge Orders         Ordered    acetaminophen (TYLENOL) 500 MG tablet  Every 8 hours PRN     10/21/17 1713    ibuprofen (ADVIL,MOTRIN) 600 MG tablet  Every 6 hours PRN     10/21/17 1713           Clarene Curran A, PA-C 10/21/17 1744    Tilden Fossa, MD 10/22/17 [phone]

## 2017-10-21 NOTE — ED Notes (Signed)
Ice pack given to pt for pain.

## 2017-10-28 ENCOUNTER — Emergency Department (HOSPITAL_COMMUNITY)
Admission: EM | Admit: 2017-10-28 | Discharge: 2017-10-28 | Disposition: A | Discharge status: Home / Self Care | Payer: No Typology Code available for payment source | Attending: Emergency Medicine | Admitting: Emergency Medicine

## 2017-10-28 ENCOUNTER — Encounter (HOSPITAL_COMMUNITY): Payer: Self-pay

## 2017-10-28 ENCOUNTER — Other Ambulatory Visit: Payer: Self-pay

## 2017-10-28 DIAGNOSIS — M79641 Pain in right hand: Secondary | ICD-10-CM | POA: Insufficient documentation

## 2017-10-28 DIAGNOSIS — Z79899 Other long term (current) drug therapy: Secondary | ICD-10-CM | POA: Diagnosis not present

## 2017-10-28 DIAGNOSIS — X503XXA Overexertion from repetitive movements, initial encounter: Secondary | ICD-10-CM | POA: Insufficient documentation

## 2017-10-28 NOTE — ED Triage Notes (Signed)
Pt endorses right hand pain/swelling x 1 week, seen here and referred to a hand surgeon. Pt was placed on light duty at work and given prescription. When pt went back to work and they would not take the work note the way it was worded. Pt not complaining of any new problem. VSS

## 2017-10-28 NOTE — ED Provider Notes (Signed)
[facility] EMERGENCY DEPARTMENT Provider Note   CSN: [id number] Arrival date & time: 10/28/17  1212     History   Chief Complaint Chief Complaint  Patient presents with  . Hand Pain    HPI Andrea Villegas is a 57 y.o. female presents to the emergency department with a chief complaint of right hand pain.  Patient states she was seen approximately 1 week ago for this issue and is awaiting an appointment with an orthopedic or hand surgeon for reevaluation. She states she tried to make an appointment and they were booked for 3 weeks. Patient states prior medical provider in the ED placed her on light duty however her work will not accept this note because it is not descriptive of exactly light duty entails.   States she has had pain described as a throbbing.  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  She is right-hand dominant.  States she has some tingling in digits 1 through 3 which worsens at the end of the work day.  Her pain is worse at the end of the day.  No injuries.  Patient states pain has been made better with the brace she was provided and ibuprofen and Tylenol, however work will not allow her to return without an additional work note until she is seen by the Hydrographic surveyor.  Denies known injuries, wounds, falls, fever, chills, swelling to extremities, redness or warmth.  HPI  Past Medical History:  Diagnosis Date  . Lung nodules   . Medical history non-contributory     Patient Active Problem List   Diagnosis Date Noted  . Right ovarian cyst 07/16/2014  . Influenza A   . Shortness of breath   . Pyrexia   . Viral illness   . Hypoxia 04/09/2014  . Acute respiratory failure with hypoxia (HCC) 04/09/2014  . Elevated LFTs 04/09/2014  . SIRS (systemic inflammatory response syndrome) (HCC) 04/09/2014    Past  Surgical History:  Procedure Laterality Date  . CHOLECYSTECTOMY    . TUBAL LIGATION       OB History   None      Home Medications    Prior to Admission medications   Medication Sig Start Date End Date Taking? Authorizing Provider  acetaminophen (TYLENOL) 500 MG tablet Take 2 tablets (1,000 mg total) by mouth every 8 (eight) hours as needed. 10/21/17   McDonald, Mia A, PA-C  Biotin 1000 MCG tablet Take 1,000 mcg by mouth daily.    [provider]  HYDROcodone-homatropine (HYCODAN) 5-1.5 MG/5ML syrup Take 5 mLs by mouth every 6 (six) hours as needed for cough. 01/10/15   Patel-Mills, Lorelle Formosa, PA-C  ibuprofen (ADVIL,MOTRIN) 600 MG tablet Take 1 tablet (600 mg total) by mouth every 6 (six) hours as needed. 10/21/17   McDonald, Mia A, PA-C  metroNIDAZOLE (FLAGYL) 500 MG tablet Take 1 tablet (500 mg total) by mouth 2 (two) times daily. 03/23/16   Long, Arlyss Repress, MD  Multiple Vitamin (MULTIVITAMIN WITH MINERALS) TABS Take 1 tablet by mouth daily.    [provider]  ondansetron (ZOFRAN) 4 MG tablet Take 1 tablet (4 mg total) by mouth every 6 (six) hours. 03/23/16   Long, Arlyss Repress, MD  oxyCODONE-acetaminophen (PERCOCET/ROXICET) 5-325 MG tablet Take 2 tablets by mouth every 4 (four) hours as needed for severe pain. 03/23/16   Long, Arlyss Repress, MD  vitamin C (ASCORBIC ACID) 500 MG tablet Take 500 mg by mouth daily.    [provider]    Family History Family History  Problem Relation Age of Onset  . Diabetes Mellitus II Father   . CAD Father     Social History Social History   Tobacco Use  . Smoking status: Never Smoker  Substance Use Topics  . Alcohol use: No  . Drug use: No     Allergies   Patient has no known allergies.   Review of Systems Review of Systems   Physical Exam Updated Vital Signs BP (!) 128/96 (BP Location: Right Arm)   Pulse (!) 107   Temp 98.7 F (37.1 C) (Oral)   Resp 16   SpO2 98%   Physical Exam  Constitutional: She appears  well-developed and well-nourished. No distress.  HENT:  Head: Atraumatic.  Eyes: Pupils are equal, round, and reactive to light.  Neck: Normal range of motion.  Cardiovascular: Normal rate and intact distal pulses.  Pulmonary/Chest: No respiratory distress.  Abdominal: She exhibits no distension.  Musculoskeletal: Normal range of motion.  Tender to palpation over the dorsum of the right hand. Non tender snuffbox. Radial pulses are 2+ and symmetric.  Decreased grip strength to right upper extremity secondary to pain. Full active and passive range of motion of the lateral upper extremity including shoulder, elbow, and wrist.   Positive Phalen's test and Tenel test.   Neurological: She is alert.  Intact sensation to sharp and dull to bilateral upper extremities.  Skin: Skin is warm and dry.  No rash, erythema, or warmth.  No edema to bilateral upper extremity.  Psychiatric: She has a normal mood and affect.  Nursing note and vitals reviewed.    ED Treatments / Results  Labs (all labs ordered are listed, but only abnormal results are displayed) Labs Reviewed - No data to display  EKG None  Radiology No results found.  Procedures Procedures (including critical care time)  Medications Ordered in ED Medications - No data to display   Initial Impression / Assessment and Plan / ED Course  I have reviewed the triage vital signs and the nursing notes pulse past medical history.  Pertinent labs & imaging results that were available during my care of the patient were reviewed by me and considered in my medical decision making (see chart for details).  Presents for additional work note until she is able to be seen by orthopedics or Hydrographic surveyor for her right hand pain.  Was seen on 10/21/2017 and provided a light duty work note however her work will not accept this note because it does not describe what light duty entails.  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  Her exam remains unchanged since her visit.  Negative plain film from previous visit. No concern for infection, septic joint, gout.  No known injury.  Patient's pain well controlled with home Tylenol and ibuprofen.  Positive Phalen's and Tinel's tests in right upper extremity.  Most likely has carpal tunnel.  Patient needs to make appointment for evaluation with hand surgeon or Orthopedics.  Strict return precautions given.  Vital signs stable.  Will provide light duty work note.  Patient voiced understanding.    Final Clinical Impressions(s) / ED Diagnoses   Final diagnoses:  Hand pain, right    ED Discharge Orders    None       Clarivel Callaway A,  PA-C 10/28/17 1326    Tegeler, Canary Brimhristopher J, MD 10/28/17 [phone]

## 2017-10-28 NOTE — Discharge Instructions (Addendum)
Reevaluated today for right hand pain.  Please follow-up with either orthopedics or a hand surgeon for reevaluation.  You may take Tylenol and ibuprofen as prescribed for your pain.  Please continue to wear the brace on your right hand. I have written a work note for light duty. Return to the ED with any new or worsening symptoms such as:  Contact a health care provider if: Your pain does not get better after a few days of self-care. Your pain gets worse. Your pain affects your ability to do your daily activities. Get help right away if: Your hand becomes warm, red, or swollen. Your hand is numb or tingling. Your hand is extremely swollen or deformed. Your hand or fingers turn white or blue. You cannot move your hand, wrist, or fingers.

## 2017-10-28 NOTE — ED Notes (Signed)
Patient verbalized understanding of discharge instructions and denies any further needs or questions at this time. VS stable. Patient ambulatory with steady gait.  

## 2017-11-06 ENCOUNTER — Ambulatory Visit (INDEPENDENT_AMBULATORY_CARE_PROVIDER_SITE_OTHER): Payer: BLUE CROSS/BLUE SHIELD | Admitting: Orthopaedic Surgery

## 2019-12-05 IMAGING — CR DG WRIST COMPLETE 3+V*R*
4 series · 4 of 4 positions shown · non-contrast
Comparison: None.

CLINICAL DATA: Right hand pain.

EXAM:
RIGHT WRIST - COMPLETE 3+ VIEW

[wrist pa]
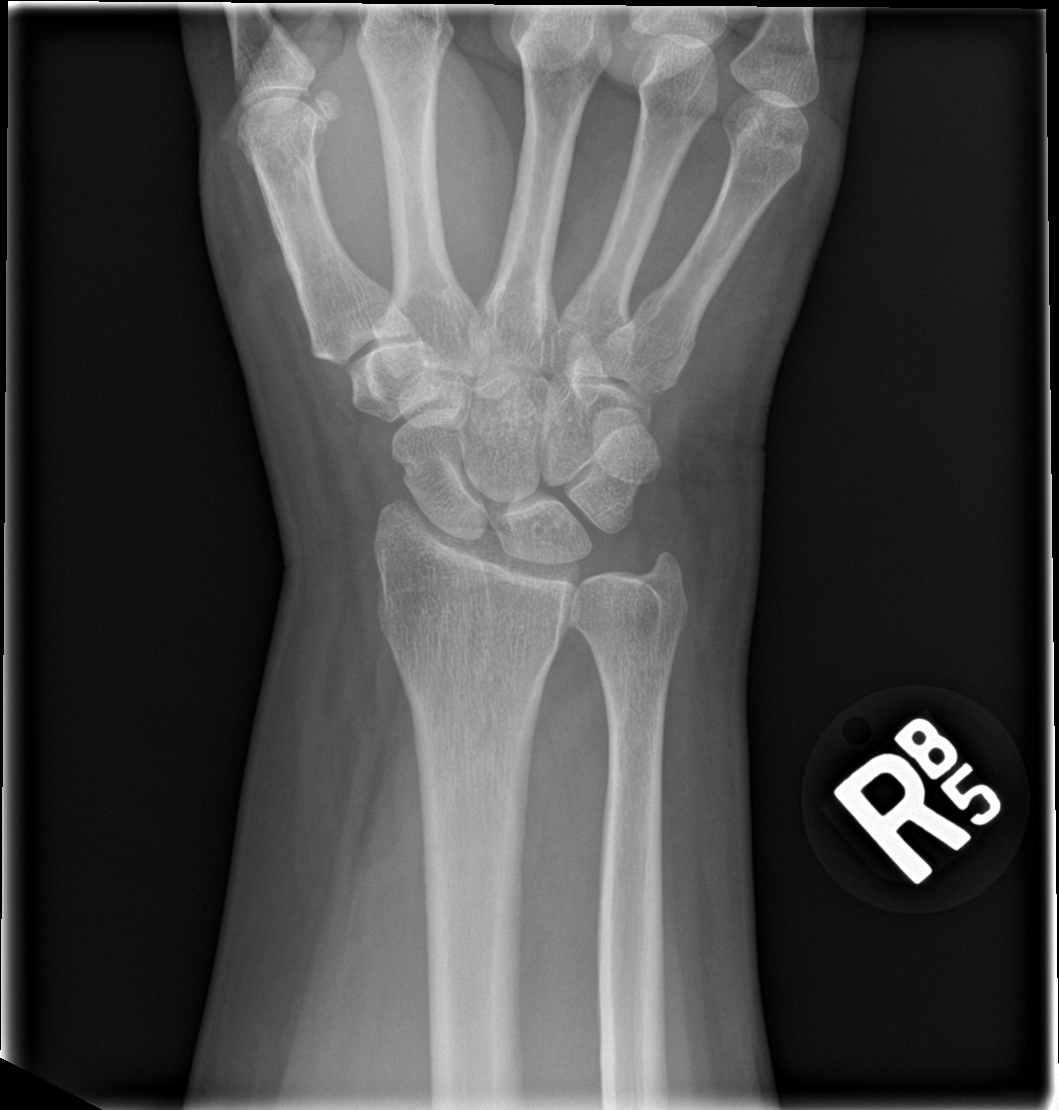

[wrist obl]
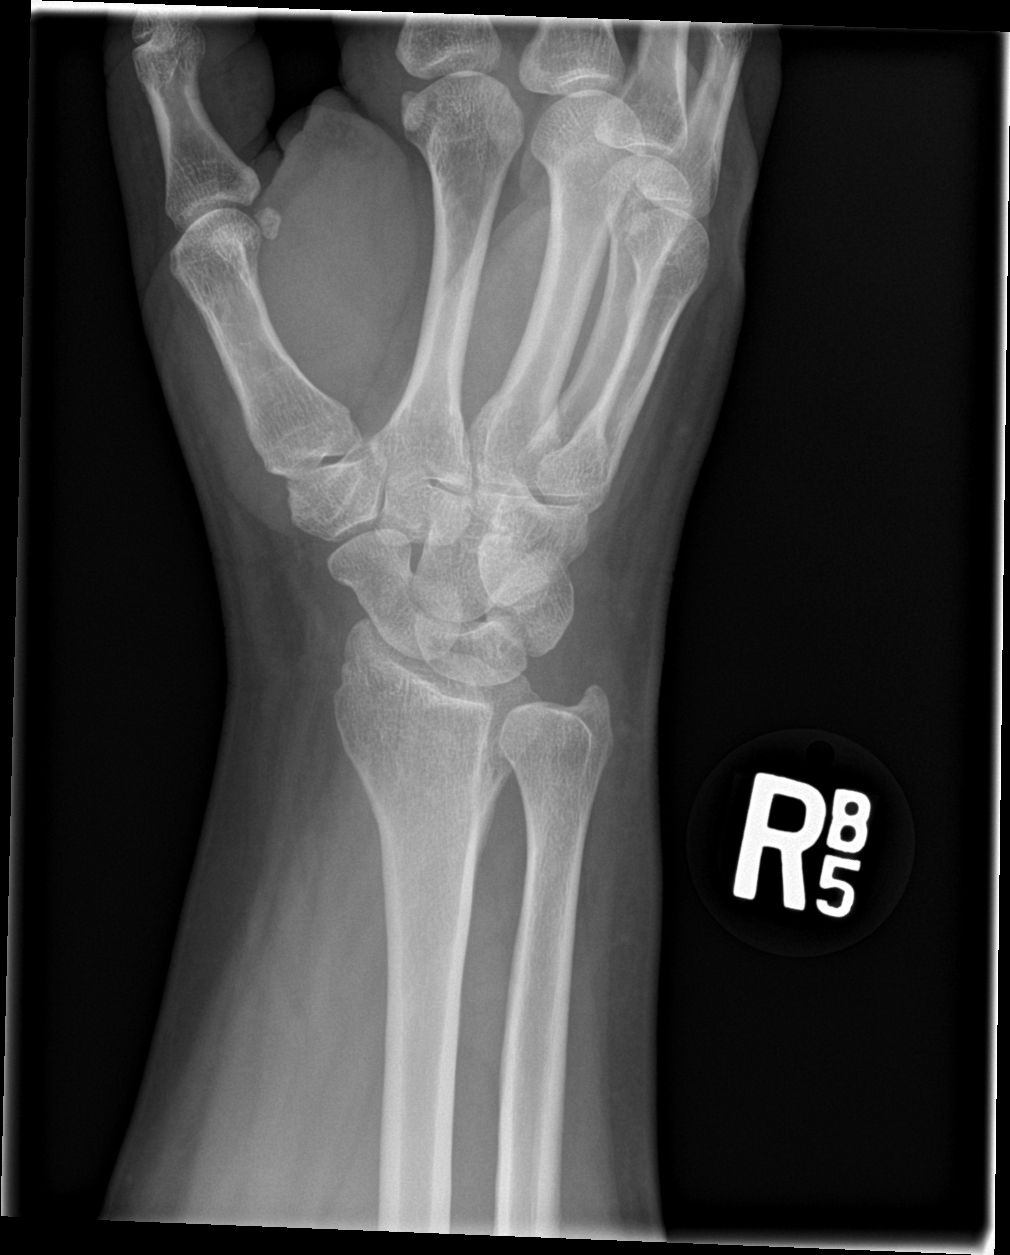

[wrist lat]
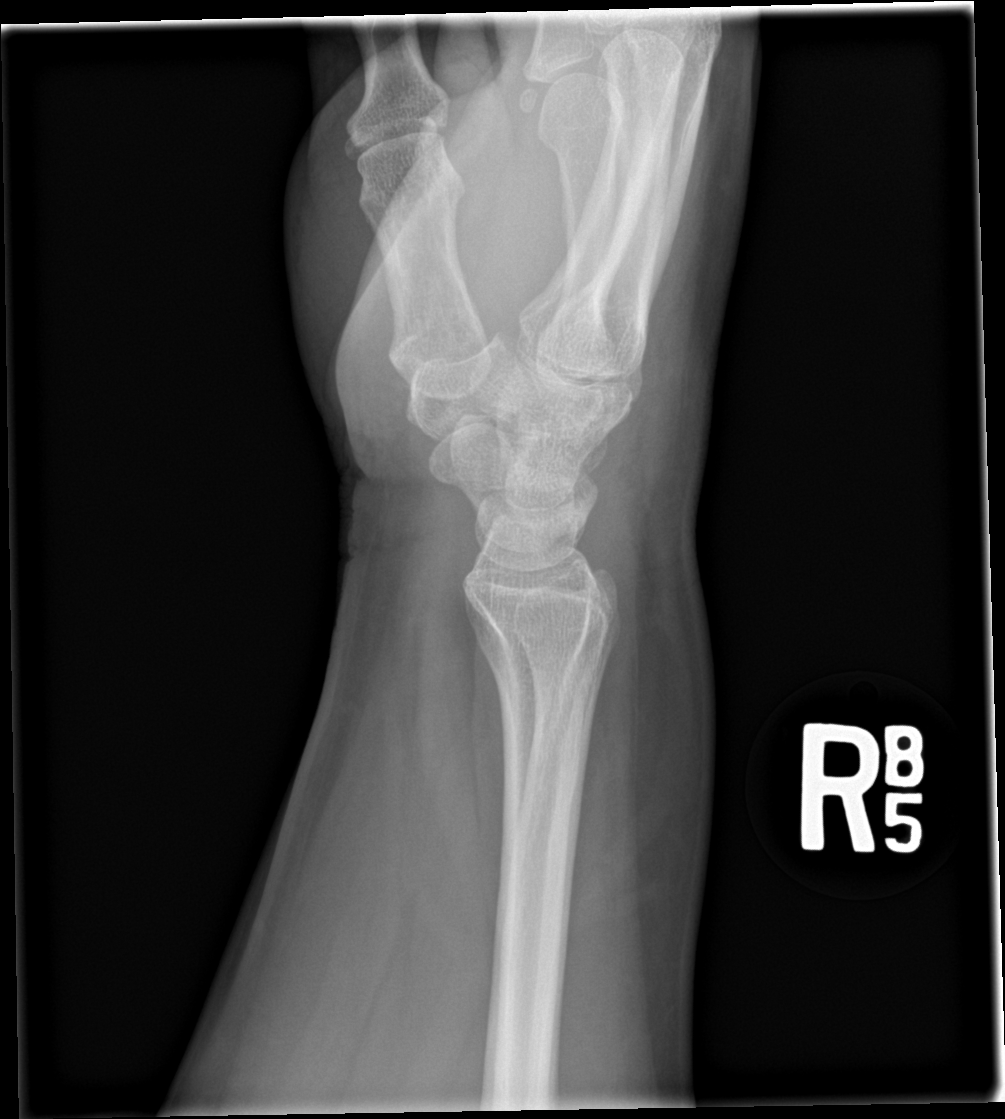

[wrist navicular]
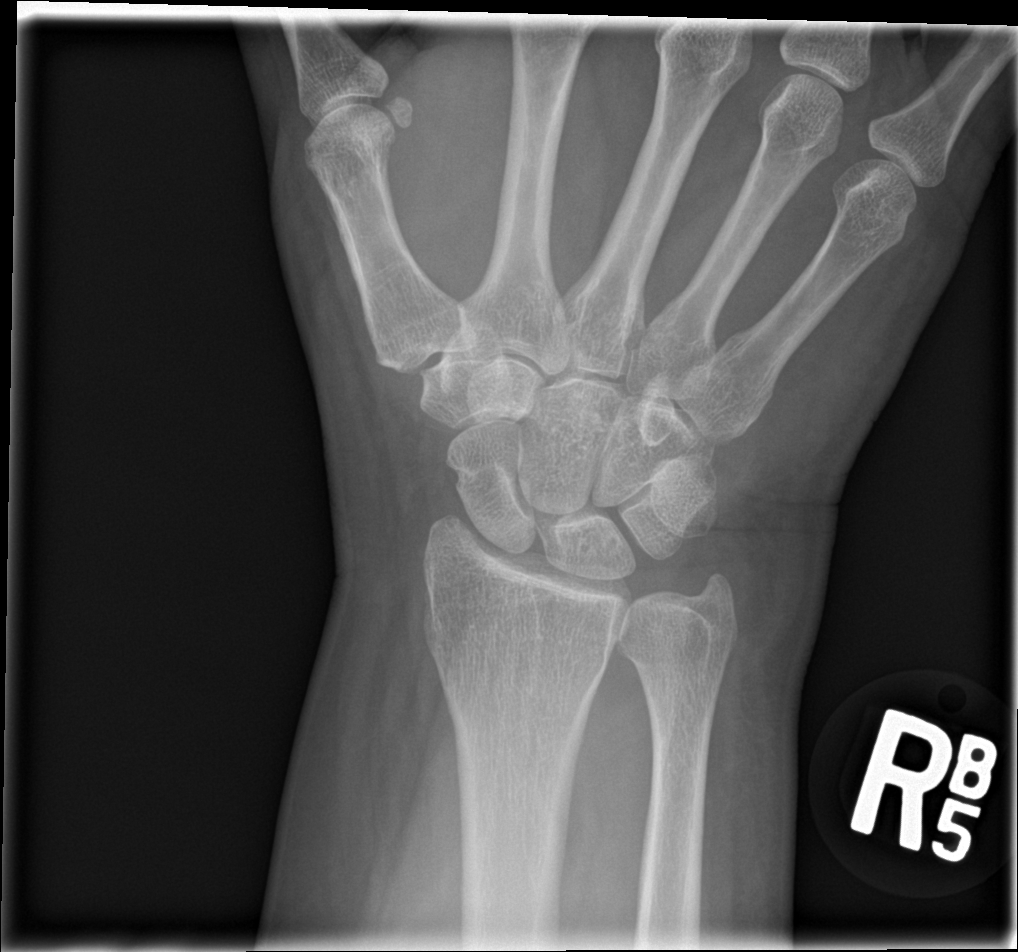

[4 of 4 positions shown; findings below may reference images not displayed]

FINDINGS: There is no evidence of fracture or dislocation. There is no
evidence of arthropathy or other focal bone abnormality. Soft
tissues are unremarkable.
IMPRESSION: Negative.

## 2019-12-05 IMAGING — CR DG HAND COMPLETE 3+V*R*
3 series · 3 of 3 positions shown · non-contrast
Comparison: None.

CLINICAL DATA: Right hand pain.

EXAM:
RIGHT HAND - COMPLETE 3+ VIEW

[hand pa]
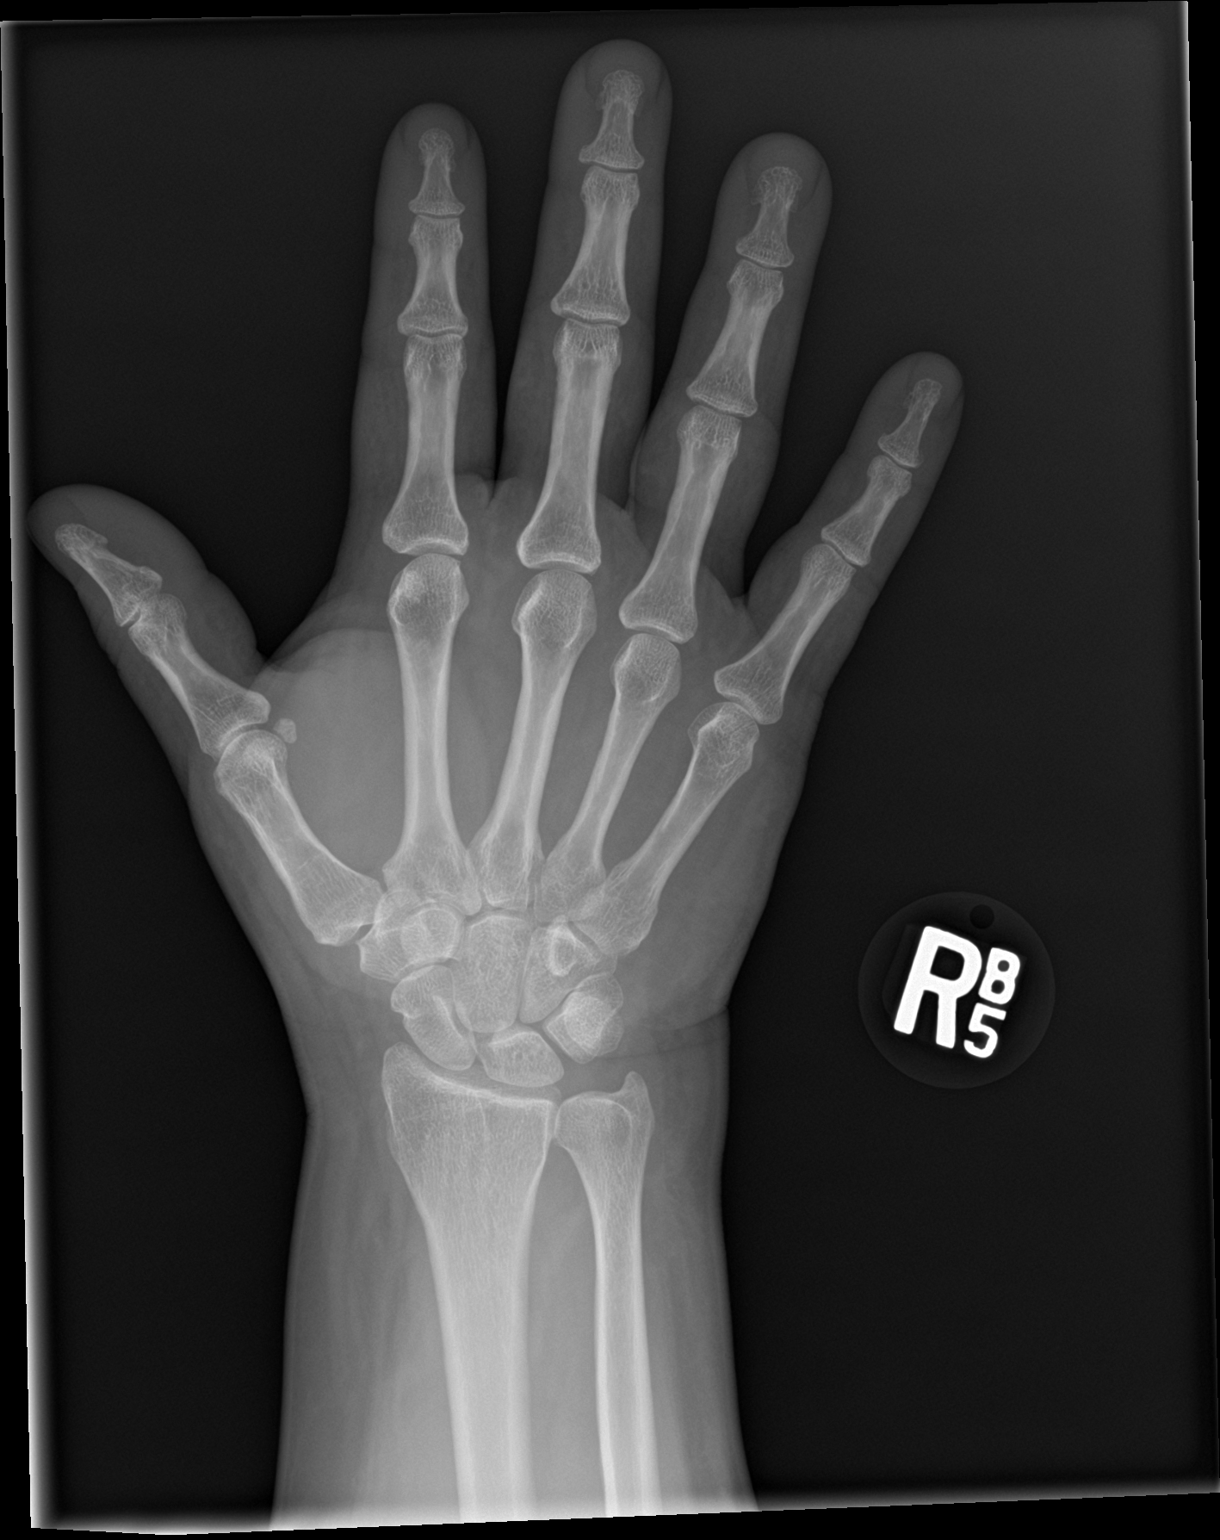

[hand obl]
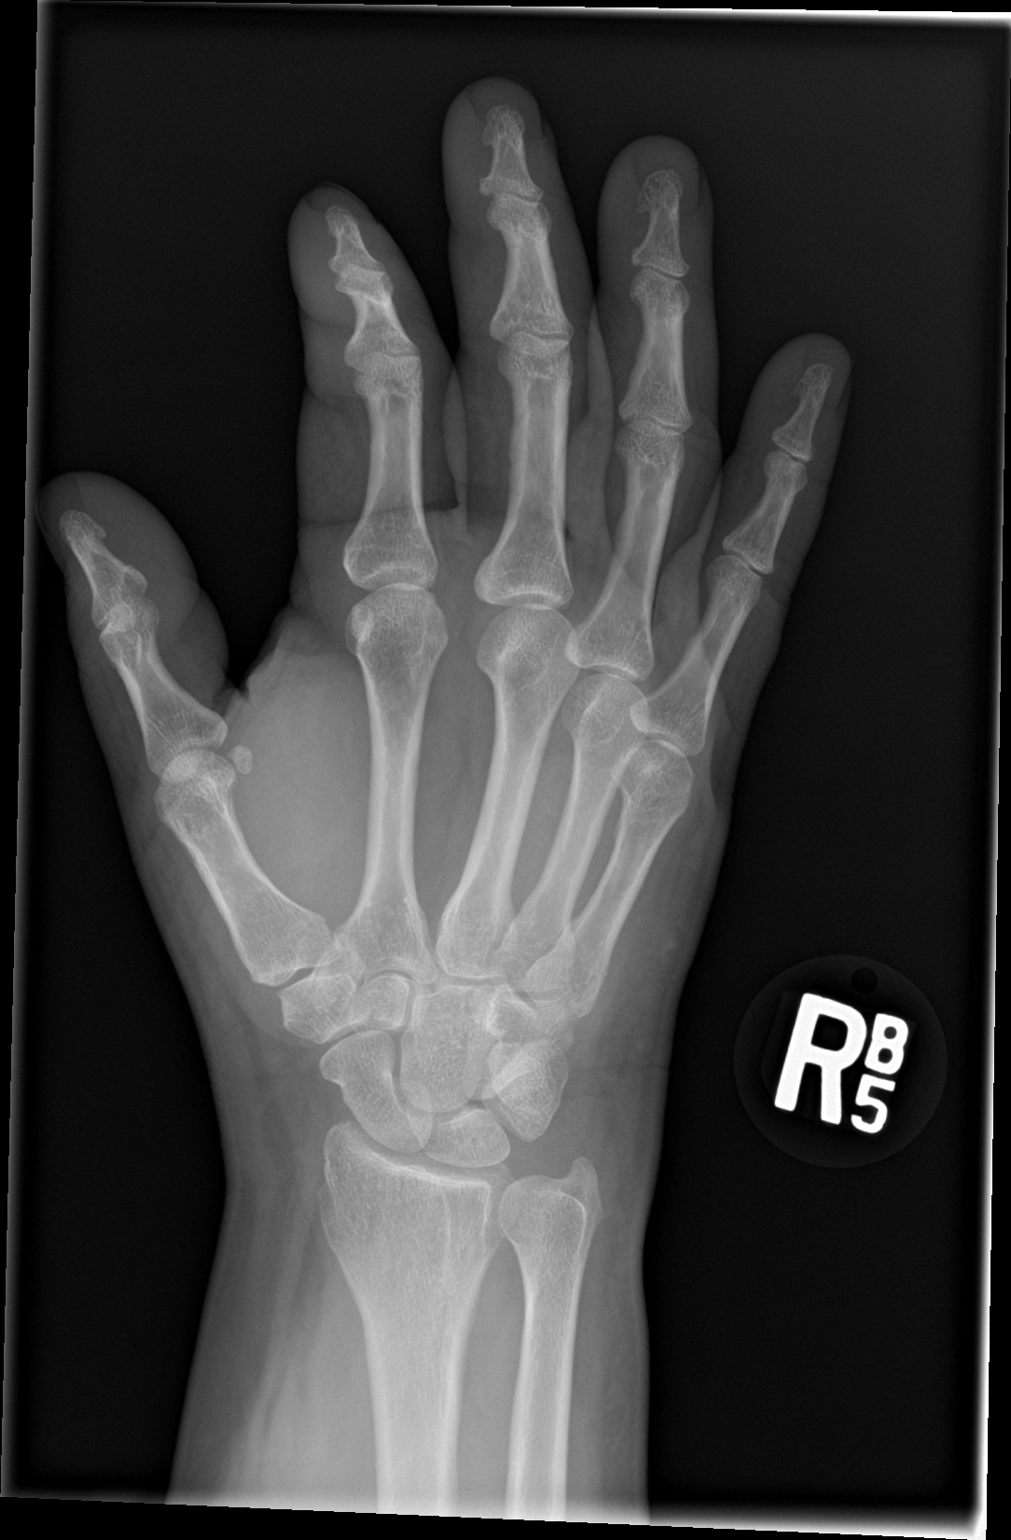

[hand lat]
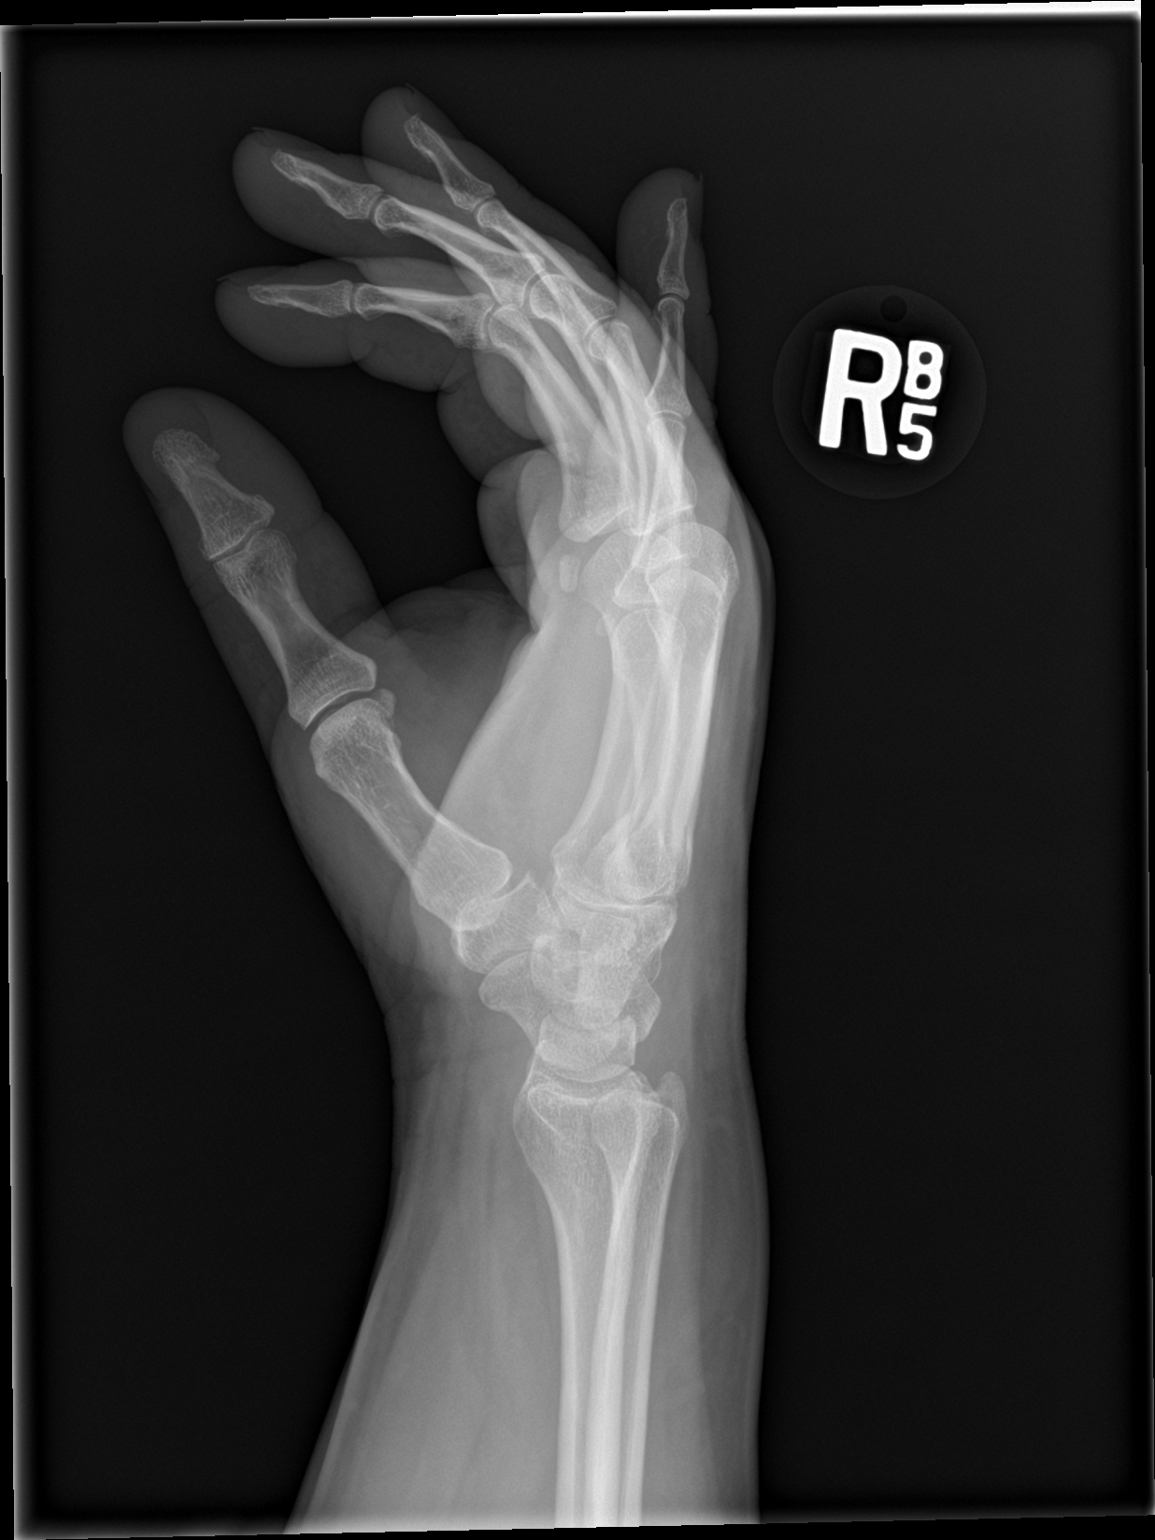

[3 of 3 positions shown; findings below may reference images not displayed]

FINDINGS: There is no evidence of fracture or dislocation. There is no
evidence of arthropathy or other focal bone abnormality. Soft
tissues are unremarkable.
IMPRESSION: Negative.

## 2020-11-02 ENCOUNTER — Other Ambulatory Visit: Payer: Self-pay

## 2020-11-02 ENCOUNTER — Emergency Department (HOSPITAL_COMMUNITY)
Admission: EM | Admit: 2020-11-02 | Discharge: 2020-11-03 | Disposition: A | Discharge status: Home / Self Care | Payer: Self-pay | Attending: Emergency Medicine | Admitting: Emergency Medicine

## 2020-11-02 ENCOUNTER — Encounter (HOSPITAL_COMMUNITY): Payer: Self-pay | Admitting: Emergency Medicine

## 2020-11-02 DIAGNOSIS — I700 Atherosclerosis of aorta: Secondary | ICD-10-CM | POA: Insufficient documentation

## 2020-11-02 DIAGNOSIS — Z791 Long term (current) use of non-steroidal anti-inflammatories (NSAID): Secondary | ICD-10-CM | POA: Insufficient documentation

## 2020-11-02 DIAGNOSIS — K5730 Diverticulosis of large intestine without perforation or abscess without bleeding: Secondary | ICD-10-CM | POA: Insufficient documentation

## 2020-11-02 DIAGNOSIS — R1030 Lower abdominal pain, unspecified: Secondary | ICD-10-CM

## 2020-11-02 DIAGNOSIS — R319 Hematuria, unspecified: Secondary | ICD-10-CM | POA: Insufficient documentation

## 2020-11-02 DIAGNOSIS — N390 Urinary tract infection, site not specified: Secondary | ICD-10-CM | POA: Insufficient documentation

## 2020-11-02 LAB — I-STAT BETA HCG BLOOD, ED (MC, WL, AP ONLY): I-stat hCG, quantitative: <5 m[IU]/mL (ref <5)

## 2020-11-02 LAB — URINALYSIS, ROUTINE W REFLEX MICROSCOPIC
Bilirubin Urine: NEGATIVE
Glucose, UA: NEGATIVE mg/dL
Ketones, ur: 15 mg/dL — AB
Nitrite: NEGATIVE
Protein, ur: >300 mg/dL — AB
Specific Gravity, Urine: >1.03 — ABNORMAL HIGH (ref 1.005–1.030)
pH: 5.5 (ref 5.0–8.0)

## 2020-11-02 LAB — COMPREHENSIVE METABOLIC PANEL
ALT: 21 U/L (ref 0–44)
AST: 21 U/L (ref 15–41)
Albumin: 3.7 g/dL (ref 3.5–5.0)
Alkaline Phosphatase: 97 U/L (ref 38–126)
Anion gap: 9 (ref 5–15)
BUN: 14 mg/dL (ref 6–20)
CO2: 26 mmol/L (ref 22–32)
Calcium: 9.7 mg/dL (ref 8.9–10.3)
Chloride: 105 mmol/L (ref 98–111)
Creatinine, Ser: 0.73 mg/dL (ref 0.44–1.00)
GFR, Estimated: >60 mL/min (ref >60)
Glucose, Bld: 118 mg/dL — ABNORMAL HIGH (ref 70–99)
Potassium: 3.8 mmol/L (ref 3.5–5.1)
Sodium: 140 mmol/L (ref 135–145)
Total Bilirubin: 0.5 mg/dL (ref 0.3–1.2)
Total Protein: 7.5 g/dL (ref 6.5–8.1)

## 2020-11-02 LAB — URINALYSIS, MICROSCOPIC (REFLEX): WBC, UA: >50 WBC/hpf (ref 0–5)

## 2020-11-02 LAB — CBC
HCT: 47.5 % — ABNORMAL HIGH (ref 36.0–46.0)
Hemoglobin: 15.4 g/dL — ABNORMAL HIGH (ref 12.0–15.0)
MCH: 29.9 pg (ref 26.0–34.0)
MCHC: 32.4 g/dL (ref 30.0–36.0)
MCV: 92.2 fL (ref 80.0–100.0)
Platelets: 290 10*3/uL (ref 150–400)
RBC: 5.15 MIL/uL — ABNORMAL HIGH (ref 3.87–5.11)
RDW: 14.2 % (ref 11.5–15.5)
WBC: 12.9 10*3/uL — ABNORMAL HIGH (ref 4.0–10.5)
nRBC: 0 % (ref 0.0–0.2)

## 2020-11-02 LAB — LIPASE, BLOOD: Lipase: 44 U/L (ref 11–51)

## 2020-11-02 NOTE — ED Provider Notes (Signed)
Emergency Medicine Provider Triage Evaluation Note  Andrea Villegas , a 60 y.o. female  was evaluated in triage.  Pt complains of vaginal bleeding since Friday.  She reports cramping in her lower abdomen and into her back.  No urinary symptoms.  No fevers.   Review of Systems  Positive: Abdominal pain, back pain Negative: N/V/D.   Physical Exam  BP 117/81 (BP Location: Right Arm)   Pulse 84   Temp 98.7 F (37.1 C) (Oral)   Resp 18   Ht 5' (1.524 m)   Wt 77.1 kg   SpO2 97%   BMI 33.20 kg/m  Gen:   Awake, no distress   Resp:  Normal effort  MSK:   Moves extremities without difficulty  Other:  GU exam deferred due to triage location.   Medical Decision Making  Medically screening exam initiated at 8:51 PM.  Appropriate orders placed.  Andrea Villegas was informed that the remainder of the evaluation will be completed by another provider, this initial triage assessment does not replace that evaluation, and the importance of remaining in the ED until their evaluation is complete.  Labs are ordered.  She has a history of diverticulitis on chart review, CT abdomen pelvis with contrast is ordered. I did discuss with her the importance of if she chooses to leave prior to full evaluation that she ensure she has adequate OB/GYN follow-up.  We discussed that at times vaginal bleeding in someone who is postmenopausal pain be the first sign of endometrial cancer or other complications and that she needs to ensure she gets this follow-up.  She states her understanding.  Note: Portions of this report may have been transcribed using voice recognition software. Every effort was made to ensure accuracy; however, inadvertent computerized transcription errors may be present      Andrea Villegas 11/02/20 2056    Benjiman Core, MD 11/02/20 2351

## 2020-11-02 NOTE — ED Triage Notes (Signed)
Pt here for vaginal bleeding Friday-Sunday, w/ lower abd cramps and lower back pain. Reports some nausea, denies V/D. Pt is postmenopausal.

## 2020-11-03 ENCOUNTER — Emergency Department (HOSPITAL_COMMUNITY): Payer: Self-pay

## 2020-11-03 MED ORDER — CEPHALEXIN 500 MG PO CAPS: 500.0000 mg | ORAL_CAPSULE | Freq: Two times a day (BID) | ORAL | 0 refills | Status: AC

## 2020-11-03 NOTE — Discharge Instructions (Addendum)
I have prescribed antibiotics to help treat your urinary tract infection.  Please take 1 tablet twice a day for the next 7 days.  Your urine was also sent for culture, therefore if a separate organism grows, you might need a different kind of antibiotics.  You will be called if changes need to be made.  If you experience any back pain, fever, chills, nausea or worsening symptoms you will need to return to the emergency department.

## 2020-11-03 NOTE — ED Notes (Signed)
Got patient into a gown on the monitor patient is resting with call bell in reach 

## 2020-11-03 NOTE — ED Notes (Signed)
Discharge instructions reviewed with patient. Patient verbalized understanding of instructions. Follow-up care and medications were reviewed. Patient ambulatory with steady gait. VSS upon discharge.  ?

## 2020-11-03 NOTE — ED Provider Notes (Signed)
[facility] EMERGENCY DEPARTMENT Provider Note   CSN: [id number] Arrival date & time: 11/02/20  1744     History Chief Complaint  Patient presents with   Abdominal Pain   Back Pain    Andrea Villegas is a 60 y.o. female.  60 y.o female with a PMH of SIRS, Lung nodules presents to the ED with a chief complaint of vaginal bleeding and back pain x 1 week.  Patient reports the suprapubic pain has been radiating to her lower back, this has been ongoing for the past week.  She also endorses an episode of vaginal bleeding consistent of spotting that occurred for approximately a day.  She reports she has been taking some anti-inflammatories to help with her back pain with improvement in her symptoms.  In addition, she endorses changes in her urine, reporting the color has changed somewhat.  He has not been taking anything describes her urine as "like it took a lot of vitamins ".  She has had a prior history of diverticulitis, reports she was concerned that this might be occurring again.  She has also endorsed some nausea however has not had any vomiting.  No fevers, no sick contacts, no recent travel, no prior surgical intervention to her abdomen.    The history is provided by the patient and medical records.  Abdominal Pain Pain location:  Suprapubic Pain quality: cramping   Pain radiates to:  Back Onset quality:  Gradual Duration:  1 week Timing:  Constant Progression:  Worsening Context: not previous surgeries, not sick contacts and not suspicious food intake   Relieved by:  NSAIDs Worsened by:  Nothing Ineffective treatments:  NSAIDs Associated symptoms: nausea   Associated symptoms: no chest pain, no chills, no diarrhea, no fever, no shortness of breath, no sore throat and no vomiting   Risk factors: obesity   Risk factors: no alcohol abuse, no aspirin use, has not had multiple surgeries, no NSAID use and not pregnant   Back Pain Associated symptoms: abdominal  pain   Associated symptoms: no chest pain and no fever       Past Medical History:  Diagnosis Date   Lung nodules    Medical history non-contributory     Patient Active Problem List   Diagnosis Date Noted   Right ovarian cyst 07/16/2014   Influenza A    Shortness of breath    Pyrexia    Viral illness    Hypoxia 04/09/2014   Acute respiratory failure with hypoxia (HCC) 04/09/2014   Elevated LFTs 04/09/2014   SIRS (systemic inflammatory response syndrome) (HCC) 04/09/2014    Past Surgical History:  Procedure Laterality Date   CHOLECYSTECTOMY     TUBAL LIGATION       OB History   No obstetric history on file.     Family History  Problem Relation Age of Onset   Diabetes Mellitus II Father    CAD Father     Social History   Tobacco Use   Smoking status: Never  Substance Use Topics   Alcohol use: No   Drug use: No    Home Medications Prior to Admission medications   Medication Sig Start Date End Date Taking? Authorizing Provider  cephALEXin (KEFLEX) 500 MG capsule Take 1 capsule (500 mg total) by mouth 2 (two) times daily for 7 days. 11/03/20 11/10/20 Yes Vercie Pokorny, PA-C  naproxen sodium (ALEVE) 220 MG tablet Take 440 mg by mouth daily as needed (pain/headache).   Yes [provider]  acetaminophen (TYLENOL) 500 MG tablet Take 2 tablets (1,000 mg total) by mouth every 8 (eight) hours as needed. Patient not taking: Reported on 11/03/2020 10/21/17   McDonald, Mia A, PA-C  HYDROcodone-homatropine (HYCODAN) 5-1.5 MG/5ML syrup Take 5 mLs by mouth every 6 (six) hours as needed for cough. Patient not taking: Reported on 11/03/2020 01/10/15   Patel-Mills, Lorelle Formosa, PA-C  ibuprofen (ADVIL,MOTRIN) 600 MG tablet Take 1 tablet (600 mg total) by mouth every 6 (six) hours as needed. Patient not taking: Reported on 11/03/2020 10/21/17   McDonald, Mia A, PA-C  ondansetron (ZOFRAN) 4 MG tablet Take 1 tablet (4 mg total) by mouth every 6 (six) hours. Patient not taking: Reported  on 11/03/2020 03/23/16   Long, Arlyss Repress, MD  oxyCODONE-acetaminophen (PERCOCET/ROXICET) 5-325 MG tablet Take 2 tablets by mouth every 4 (four) hours as needed for severe pain. Patient not taking: Reported on 11/03/2020 03/23/16   Long, Arlyss Repress, MD    Allergies    Patient has no known allergies.  Review of Systems   Review of Systems  Constitutional:  Negative for chills and fever.  HENT:  Negative for sore throat.   Respiratory:  Negative for shortness of breath.   Cardiovascular:  Negative for chest pain.  Gastrointestinal:  Positive for abdominal pain and nausea. Negative for diarrhea and vomiting.  Genitourinary:  Negative for flank pain.  Musculoskeletal:  Positive for back pain.  Neurological:  Negative for light-headedness.  All other systems reviewed and are negative.  Physical Exam Updated Vital Signs BP 130/82   Pulse 78   Temp 98.7 F (37.1 C) (Oral)   Resp 17   Ht 5' (1.524 m)   Wt 77.1 kg   SpO2 92%   BMI 33.20 kg/m   Physical Exam Vitals and nursing note reviewed.  Constitutional:      General: She is not in acute distress.    Appearance: She is well-developed. She is not ill-appearing.  HENT:     Head: Normocephalic and atraumatic.     Mouth/Throat:     Mouth: Mucous membranes are moist.  Cardiovascular:     Rate and Rhythm: Normal rate.  Pulmonary:     Effort: Pulmonary effort is normal.     Breath sounds: No wheezing.  Abdominal:     General: Abdomen is flat. Bowel sounds are normal.     Palpations: Abdomen is soft. There is no shifting dullness.     Tenderness: There is abdominal tenderness in the suprapubic area. There is no right CVA tenderness or left CVA tenderness.  Skin:    General: Skin is warm and dry.  Neurological:     Mental Status: She is alert.    ED Results / Procedures / Treatments   Labs (all labs ordered are listed, but only abnormal results are displayed) Labs Reviewed  COMPREHENSIVE METABOLIC PANEL - Abnormal; Notable for the  following components:      Result Value   Glucose, Bld 118 (*)    All other components within normal limits  CBC - Abnormal; Notable for the following components:   WBC 12.9 (*)    RBC 5.15 (*)    Hemoglobin 15.4 (*)    HCT 47.5 (*)    All other components within normal limits  URINALYSIS, ROUTINE W REFLEX MICROSCOPIC - Abnormal; Notable for the following components:   APPearance HAZY (*)    Specific Gravity, Urine >1.030 (*)    Hgb urine dipstick SMALL (*)    Ketones,  ur 15 (*)    Protein, ur >300 (*)    Leukocytes,Ua SMALL (*)    All other components within normal limits  URINALYSIS, MICROSCOPIC (REFLEX) - Abnormal; Notable for the following components:   Bacteria, UA FEW (*)    All other components within normal limits  URINE CULTURE  LIPASE, BLOOD  I-STAT BETA HCG BLOOD, ED ([facility], [facility], [facility] ONLY)    EKG None  Radiology CT ABDOMEN PELVIS WO CONTRAST  Result Date: 11/03/2020 CLINICAL DATA:  Evaluate for diverticulitis EXAM: CT ABDOMEN AND PELVIS WITHOUT CONTRAST TECHNIQUE: Multidetector CT imaging of the abdomen and pelvis was performed following the standard protocol without IV contrast. COMPARISON:  03/23/2016 FINDINGS: Lower chest: No acute abnormality. Hepatobiliary: No focal liver abnormality is seen. Status post cholecystectomy. No biliary dilatation. Pancreas: Unremarkable. No pancreatic ductal dilatation or surrounding inflammatory changes. Spleen: Normal in size without focal abnormality. Adrenals/Urinary Tract: Adrenal glands are unremarkable. Duplicated left renal collecting system. Kidneys are otherwise normal, without renal calculi, focal lesion, or hydronephrosis. Bladder is unremarkable. Stomach/Bowel: Stomach is within normal limits. Appendix appears normal. No evidence of bowel wall thickening, distention, or inflammatory changes. Scattered colonic diverticula noted. No signs of acute diverticulitis. Vascular/Lymphatic: Aortic atherosclerosis. No aneurysm. No  abdominopelvic adenopathy. Reproductive: Several small subserosal fibroids are noted. No adnexal mass. Other: No free fluid or fluid collections. Musculoskeletal: No acute or significant osseous findings. IMPRESSION: 1. No acute findings within the abdomen or pelvis. 2. Colonic diverticulosis without signs of acute diverticulitis. 3. Aortic Atherosclerosis (ICD10-I70.0). Electronically Signed   By: Signa Kell M.D.   On: 11/03/2020 05:39    Procedures Procedures   Medications Ordered in ED Medications - No data to display  ED Course  I have reviewed the triage vital signs and the nursing notes.  Pertinent labs & imaging results that were available during my care of the patient were reviewed by me and considered in my medical decision making (see chart for details).  Clinical Course as of 11/03/20 1446  Tue Nov 03, 2020  1310 Hgb urine dipstick(!): SMALL [JS]  1310 Glori Luis): SMALL [JS]  1310 WBC, UA: >50 [JS]  1310 WBC, UA: >50 [JS]    Clinical Course User Index [JS] Claude Manges, PA-C   MDM Rules/Calculators/A&P  Patient with no pertinent past medical history presents to the ED with a chief complaint of suprapubic pain, vaginal bleeding, back pain for the past week.  Does endorse an episode of vaginal bleeding, which lasted about a day consisting mainly of spotting.  Patient is menopausal, reports she does not have cycles anymore.  She does report some pain along the lower lumbar spine, this is relieved with NSAID exacerbated with movement.  In the ED and was evaluated by me approximately 18 hours after episode.  Vitals are within normal limits, she is afebrile without any hypoxia or tachycardia.  Lungs are clear to auscultation.  Abdomen is soft, mild tenderness to palpation along the suprapubic area but without any focal point of tenderness.  She is concerned as she did have a history of diverticulitis and is concerned for recurrence at this time.  Interpretation of her  labs by me reveal a CMP without any electrolyte derangement, current levels within normal limits.  LFTs are within normal limits.  CBC with a leukocytosis of 12.9, hemoglobin is stable. Lipase is within normal limits. Beta HCG is negative. UA few bacteria, small hgb, small leukocytes and >50 WBC.  Urine culture has been added.  In the onset  of suprapubic pain, discomfort with urination along with different colors in her urine we will treat patient for acute UTI.  CT of the abdomen and pelvis was obtained during waiting room.  This did not show any acute finding consistent with diverticulitis  CT Abdomen and pelvis showed: 1. No acute findings within the abdomen or pelvis.  2. Colonic diverticulosis without signs of acute diverticulitis.  3. Aortic Atherosclerosis (ICD10-I70.0).      These results were discussed with patient.  We discussed the UA results in the setting of some suprapubic pain and hematuria will treat for acute UTI.  She was also courage to follow-up with PCP.  Patient is agreeable to this at this time, vitals remained within normal limits.  Patient is stable for discharge.  Portions of this note were generated with Scientist, clinical (histocompatibility and immunogenetics). Dictation errors may occur despite best attempts at proofreading.  Final Clinical Impression(s) / ED Diagnoses Final diagnoses:  Lower urinary tract infectious disease  Lower abdominal pain    Rx / DC Orders ED Discharge Orders          Ordered    cephALEXin (KEFLEX) 500 MG capsule  2 times daily        11/03/20 1444             Claude Manges, PA-C 11/03/20 1447    Gerhard Munch, MD 11/06/20 2256

## 2021-03-12 ENCOUNTER — Other Ambulatory Visit: Payer: Self-pay

## 2021-03-12 ENCOUNTER — Emergency Department (HOSPITAL_COMMUNITY)
Admission: EM | Admit: 2021-03-12 | Discharge: 2021-03-12 | Disposition: A | Discharge status: Home / Self Care | Payer: Self-pay | Attending: Emergency Medicine | Admitting: Emergency Medicine

## 2021-03-12 DIAGNOSIS — X58XXXA Exposure to other specified factors, initial encounter: Secondary | ICD-10-CM | POA: Insufficient documentation

## 2021-03-12 DIAGNOSIS — T162XXA Foreign body in left ear, initial encounter: Secondary | ICD-10-CM | POA: Insufficient documentation

## 2021-03-12 MED ORDER — HYDROXYZINE HCL 25 MG PO TABS: 25.0000 mg | ORAL_TABLET | Freq: Every day | ORAL | 0 refills | Status: DC | PRN

## 2021-03-12 NOTE — Discharge Instructions (Signed)
Please stop using Q-tips.

## 2021-03-12 NOTE — ED Triage Notes (Signed)
Pt states cotton tip of qtip in L ear

## 2021-03-12 NOTE — ED Provider Notes (Signed)
[facility] EMERGENCY DEPARTMENT Provider Note   CSN: [id number] Arrival date & time: 03/12/21  1922     History  Chief Complaint  Patient presents with   Foreign Body in Ear    Andrea Villegas is a 61 y.o. female who presents the emergency department with foreign body sensation in the left ear.  Patient states that she was cleaning ears with Q-tips when a piece of it broke off and has been stuck in her ear for 2 days.  She notes decreased hearing.   Foreign Body in Ear      Home Medications Prior to Admission medications   Medication Sig Start Date End Date Taking? Authorizing Provider  hydrOXYzine (ATARAX) 25 MG tablet Take 1 tablet (25 mg total) by mouth daily as needed ([place]). 03/12/21  Yes Meredeth Ide, Hisham Provence M, PA-C  acetaminophen (TYLENOL) 500 MG tablet Take 2 tablets (1,000 mg total) by mouth every 8 (eight) hours as needed. Patient not taking: Reported on 11/03/2020 10/21/17   McDonald, Mia A, PA-C  HYDROcodone-homatropine (HYCODAN) 5-1.5 MG/5ML syrup Take 5 mLs by mouth every 6 (six) hours as needed for cough. Patient not taking: Reported on 11/03/2020 01/10/15   Patel-Mills, Lorelle Formosa, PA-C  ibuprofen (ADVIL,MOTRIN) 600 MG tablet Take 1 tablet (600 mg total) by mouth every 6 (six) hours as needed. Patient not taking: Reported on 11/03/2020 10/21/17   McDonald, Mia A, PA-C  naproxen sodium (ALEVE) 220 MG tablet Take 440 mg by mouth daily as needed (pain/headache).    [provider]  ondansetron (ZOFRAN) 4 MG tablet Take 1 tablet (4 mg total) by mouth every 6 (six) hours. Patient not taking: Reported on 11/03/2020 03/23/16   Long, Arlyss Repress, MD  oxyCODONE-acetaminophen (PERCOCET/ROXICET) 5-325 MG tablet Take 2 tablets by mouth every 4 (four) hours as needed for severe pain. Patient not taking: Reported on 11/03/2020 03/23/16   Long, Arlyss Repress, MD      Allergies    Patient has no known allergies.    Review of Systems   Review of Systems  All other systems  reviewed and are negative.  Physical Exam Updated Vital Signs BP 128/79    Pulse 88    Temp 98.7 F (37.1 C) (Oral)    Resp 18    SpO2 95%  Physical Exam Vitals and nursing note reviewed.  Constitutional:      Appearance: Normal appearance.  HENT:     Head: Normocephalic and atraumatic.     Right Ear: Tympanic membrane, ear canal and external ear normal.     Ears:     Comments: Q-tip and in the left external auditory canal. Eyes:     General:        Right eye: No discharge.        Left eye: No discharge.     Conjunctiva/sclera: Conjunctivae normal.  Pulmonary:     Effort: Pulmonary effort is normal.  Skin:    General: Skin is warm and dry.     Findings: No rash.  Neurological:     General: No focal deficit present.     Mental Status: She is alert.  Psychiatric:        Mood and Affect: Mood normal.        Behavior: Behavior normal.    ED Results / Procedures / Treatments   Labs (all labs ordered are listed, but only abnormal results are displayed) Labs Reviewed - No data to display  EKG None  Radiology No results found.  Procedures .Foreign Body Removal  Date/Time: 03/12/2021 8:31 PM Performed by: Teressa Lower, PA-C Authorized by: Teressa Lower, PA-C  Consent: Verbal consent obtained. Consent given by: patient Patient understanding: patient states understanding of the procedure being performed Patient consent: the patient's understanding of the procedure matches consent given Procedure consent: procedure consent matches procedure scheduled Relevant documents: relevant documents present and verified Test results: test results not available Site marked: the operative site was not marked Imaging studies: imaging studies not available Patient identity confirmed: verbally with patient and arm band Body area: ear Location details: left ear  Sedation: Patient sedated: no  Patient restrained: no Patient cooperative: yes Localization method:  magnification Removal mechanism: forceps 1 objects recovered. Objects recovered: Q-tip end Post-procedure assessment: foreign body removed Patient tolerance: patient tolerated the procedure well with no immediate complications Comments: Post procedure the examination revealed normal external auditory canal without any signs of bleeding or trauma.  TM was normal.    Medications Ordered in ED Medications - No data to display  ED Course/ Medical Decision Making/ A&P                           Medical Decision Making  Andrea Villegas is a 61 y.o. female who presents to the emergency department with a foreign body in the left ear.  I successfully remove the foreign body.  Please see procedure note above.  Patient is safe for discharge.  Patient also requesting something for insomnia.  I will write her a short course of Atarax.  Final Clinical Impression(s) / ED Diagnoses Final diagnoses:  Foreign body of left ear, initial encounter    Rx / DC Orders ED Discharge Orders          Ordered    hydrOXYzine (ATARAX) 25 MG tablet  Daily PRN        03/12/21 2035              Teressa Lower, PA-C 03/12/21 2036    Charlynne Pander, MD 03/13/21 1059

## 2021-05-11 ENCOUNTER — Other Ambulatory Visit: Payer: Self-pay

## 2021-05-11 ENCOUNTER — Ambulatory Visit
Admission: EM | Admit: 2021-05-11 | Discharge: 2021-05-11 | Disposition: A | Discharge status: Home / Self Care | Payer: Self-pay

## 2021-05-11 DIAGNOSIS — K529 Noninfective gastroenteritis and colitis, unspecified: Secondary | ICD-10-CM

## 2021-05-11 NOTE — ED Triage Notes (Signed)
3 day h/o abdominal pain and nausea.  ?Confirms diarrhea. No emesis. Has been taking aleve. Pt reports sxs feel improved, but her body feels sore.  ?

## 2021-05-11 NOTE — ED Provider Notes (Signed)
?[facility] ? ? ? ?CSN: [id number] ?Arrival date & time: 05/11/21  1630 ? ? ?  ? ?History   ?Chief Complaint ?Chief Complaint  ?Patient presents with  ? Generalized Body Aches  ? ? ?HPI ?Andrea Villegas is a 61 y.o. female.  ? ?Patient here today for evaluation of abdominal pain and nausea, diarrhea that started 3 days ago. She reports that all symptoms have improved but she has had some body aches that have persisted. She reports that she needs a note for work if possible. She does not feel ready to return.  ? ?The history is provided by the patient.  ? ?Past Medical History:  ?Diagnosis Date  ? Lung nodules   ? Medical history non-contributory   ? ? ?Patient Active Problem List  ? Diagnosis Date Noted  ? Right ovarian cyst 07/16/2014  ? Influenza A   ? Shortness of breath   ? Pyrexia   ? Viral illness   ? Hypoxia 04/09/2014  ? Acute respiratory failure with hypoxia (HCC) 04/09/2014  ? Elevated LFTs 04/09/2014  ? SIRS (systemic inflammatory response syndrome) (HCC) 04/09/2014  ? ? ?Past Surgical History:  ?Procedure Laterality Date  ? CHOLECYSTECTOMY    ? TUBAL LIGATION    ? ? ?OB History   ?No obstetric history on file. ?  ? ? ? ?Home Medications   ? ?Prior to Admission medications   ?Medication Sig Start Date End Date Taking? Authorizing Provider  ?acetaminophen (TYLENOL) 500 MG tablet Take 2 tablets (1,000 mg total) by mouth every 8 (eight) hours as needed. ?Patient not taking: Reported on 11/03/2020 10/21/17   McDonald, Mia A, PA-C  ?HYDROcodone-homatropine (HYCODAN) 5-1.5 MG/5ML syrup Take 5 mLs by mouth every 6 (six) hours as needed for cough. ?Patient not taking: Reported on 11/03/2020 01/10/15   Catha Gosselin, PA-C  ?hydrOXYzine (ATARAX) 25 MG tablet Take 1 tablet (25 mg total) by mouth daily as needed ([place]). 03/12/21   Teressa Lower, PA-C  ?ibuprofen (ADVIL,MOTRIN) 600 MG tablet Take 1 tablet (600 mg total) by mouth every 6 (six) hours as needed. ?Patient not taking: Reported on  11/03/2020 10/21/17   McDonald, Mia A, PA-C  ?naproxen sodium (ALEVE) 220 MG tablet Take 440 mg by mouth daily as needed (pain/headache).    [provider]  ?ondansetron (ZOFRAN) 4 MG tablet Take 1 tablet (4 mg total) by mouth every 6 (six) hours. ?Patient not taking: Reported on 11/03/2020 03/23/16   Long, Arlyss Repress, MD  ?oxyCODONE-acetaminophen (PERCOCET/ROXICET) 5-325 MG tablet Take 2 tablets by mouth every 4 (four) hours as needed for severe pain. ?Patient not taking: Reported on 11/03/2020 03/23/16   Long, Arlyss Repress, MD  ? ? ?Family History ?Family History  ?Problem Relation Age of Onset  ? Diabetes Mellitus II Father   ? CAD Father   ? ? ?Social History ?Social History  ? ?Tobacco Use  ? Smoking status: Never  ?Substance Use Topics  ? Alcohol use: No  ? Drug use: No  ? ? ? ?Allergies   ?Patient has no known allergies. ? ? ?Review of Systems ?Review of Systems  ?Constitutional:  Negative for chills and fever.  ?Eyes:  Negative for discharge and redness.  ?Gastrointestinal:  Positive for abdominal pain, diarrhea and nausea. Negative for vomiting.  ? ? ?Physical Exam ?Triage Vital Signs ?ED Triage Vitals  ?Enc Vitals Group  ?   BP 05/11/21 1744 103/68  ?   Pulse Rate 05/11/21 1744 83  ?  Resp 05/11/21 1744 17  ?   Temp 05/11/21 1744 98.3 ?F (36.8 ?C)  ?   Temp Source 05/11/21 1744 Oral  ?   SpO2 05/11/21 1744 94 %  ?   Weight --   ?   Height --   ?   Head Circumference --   ?   Peak Flow --   ?   Pain Score 05/11/21 1746 8  ?   Pain Loc --   ?   Pain Edu? --   ?   Excl. in GC? --   ? ?No data found. ? ?Updated Vital Signs ?BP 103/68 (BP Location: Left Arm)   Pulse 83   Temp 98.3 ?F (36.8 ?C) (Oral)   Resp 17   SpO2 94%  ?   ? ?Physical Exam ?Vitals and nursing note reviewed.  ?Constitutional:   ?   General: She is not in acute distress. ?   Appearance: Normal appearance. She is not ill-appearing.  ?HENT:  ?   Head: Normocephalic and atraumatic.  ?Eyes:  ?   Conjunctiva/sclera: Conjunctivae normal.   ?Cardiovascular:  ?   Rate and Rhythm: Normal rate.  ?Pulmonary:  ?   Effort: Pulmonary effort is normal.  ?Neurological:  ?   Mental Status: She is alert.  ?Psychiatric:     ?   Mood and Affect: Mood normal.     ?   Behavior: Behavior normal.     ?   Thought Content: Thought content normal.  ? ? ? ?UC Treatments / Results  ?Labs ?(all labs ordered are listed, but only abnormal results are displayed) ?Labs Reviewed - No data to display ? ?EKG ? ? ?Radiology ?No results found. ? ?Procedures ?Procedures (including critical care time) ? ?Medications Ordered in UC ?Medications - No data to display ? ?Initial Impression / Assessment and Plan / UC Course  ?I have reviewed the triage vital signs and the nursing notes. ? ?Pertinent labs & imaging results that were available during my care of the patient were reviewed by me and considered in my medical decision making (see chart for details). ? ?  ?Suspect viral etiology of symptoms. Recommended symptomatic treatment, increased fluids and rest. Work note provided as requested. Encouraged follow up with any further concerns.  ? ?Final Clinical Impressions(s) / UC Diagnoses  ? ?Final diagnoses:  ?Gastroenteritis  ? ?Discharge Instructions   ?None ?  ? ?ED Prescriptions   ?None ?  ? ?PDMP not reviewed this encounter. ?  ?Tomi Bamberger, PA-C ?05/11/21 1928 ? ?

## 2021-05-24 ENCOUNTER — Telehealth: Payer: Self-pay | Admitting: Physician Assistant

## 2021-05-24 MED ORDER — HYDROXYZINE HCL 25 MG PO TABS: 25.0000 mg | ORAL_TABLET | Freq: Every day | ORAL | 0 refills | Status: DC | PRN

## 2021-05-24 NOTE — Telephone Encounter (Signed)
Patient here today for refill of hydroxyzine. She reports she was supposed to have this refilled at last visit but did not have refill sent.  ?

## 2021-06-01 ENCOUNTER — Ambulatory Visit
Admission: EM | Admit: 2021-06-01 | Discharge: 2021-06-01 | Disposition: A | Discharge status: Home / Self Care | Payer: Self-pay

## 2021-06-01 ENCOUNTER — Other Ambulatory Visit: Payer: Self-pay

## 2021-06-01 ENCOUNTER — Encounter: Payer: Self-pay | Admitting: Emergency Medicine

## 2021-06-01 DIAGNOSIS — K5900 Constipation, unspecified: Secondary | ICD-10-CM

## 2021-06-01 NOTE — ED Provider Notes (Addendum)
?[facility] ? ? ? ?CSN: [id number] ?Arrival date & time: 06/01/21  1325 ? ? ?  ? ?History   ?Chief Complaint ?Chief Complaint  ?Patient presents with  ? Constipation  ? ? ?HPI ?Andrea Villegas is a 61 y.o. female.  ? ?Patient presents to receive a work note that allows her to let her employer know that she needs to go to the bathroom at appropriate times.  Patient reports that she struggles with issues with constipation and abdominal cramping, and her employer does not allow her to go to the bathroom when needed so her stomach pain worsens when this occurs.  Patient had a bowel movement today that was normal in consistency.  Denies any current abdominal pain.  Patient reports that she has a history of diverticulitis that was documented in 2018 but has not seen a specialist.  Denies any associated blood in stool, nausea, vomiting, fever.  Abdominal cramping is generalized per patient.  She reports that she simply needs a work note that she typically manages her constipation with over-the-counter pills that she is not sure the name of. ? ? ?Constipation ? ?Past Medical History:  ?Diagnosis Date  ? Lung nodules   ? Medical history non-contributory   ? ? ?Patient Active Problem List  ? Diagnosis Date Noted  ? Right ovarian cyst 07/16/2014  ? Influenza A   ? Shortness of breath   ? Pyrexia   ? Viral illness   ? Hypoxia 04/09/2014  ? Acute respiratory failure with hypoxia (HCC) 04/09/2014  ? Elevated LFTs 04/09/2014  ? SIRS (systemic inflammatory response syndrome) (HCC) 04/09/2014  ? ? ?Past Surgical History:  ?Procedure Laterality Date  ? CHOLECYSTECTOMY    ? TUBAL LIGATION    ? ? ?OB History   ?No obstetric history on file. ?  ? ? ? ?Home Medications   ? ?Prior to Admission medications   ?Medication Sig Start Date End Date Taking? Authorizing Provider  ?acetaminophen (TYLENOL) 500 MG tablet Take 2 tablets (1,000 mg total) by mouth every 8 (eight) hours as needed. ?Patient not taking: Reported on 11/03/2020  10/21/17   McDonald, Mia A, PA-C  ?HYDROcodone-homatropine (HYCODAN) 5-1.5 MG/5ML syrup Take 5 mLs by mouth every 6 (six) hours as needed for cough. ?Patient not taking: Reported on 11/03/2020 01/10/15   Catha GosselinPatel-Mills, Hanna, PA-C  ?hydrOXYzine (ATARAX) 25 MG tablet Take 1 tablet (25 mg total) by mouth daily as needed ([facility]). 05/24/21   Tomi BambergerMyers, Rebecca F, PA-C  ?ibuprofen (ADVIL,MOTRIN) 600 MG tablet Take 1 tablet (600 mg total) by mouth every 6 (six) hours as needed. ?Patient not taking: Reported on 11/03/2020 10/21/17   McDonald, Mia A, PA-C  ?naproxen sodium (ALEVE) 220 MG tablet Take 440 mg by mouth daily as needed (pain/headache).    [provider]  ?ondansetron (ZOFRAN) 4 MG tablet Take 1 tablet (4 mg total) by mouth every 6 (six) hours. ?Patient not taking: Reported on 11/03/2020 03/23/16   Long, Arlyss RepressJoshua G, MD  ?oxyCODONE-acetaminophen (PERCOCET/ROXICET) 5-325 MG tablet Take 2 tablets by mouth every 4 (four) hours as needed for severe pain. ?Patient not taking: Reported on 11/03/2020 03/23/16   Long, Arlyss RepressJoshua G, MD  ? ? ?Family History ?Family History  ?Problem Relation Age of Onset  ? Diabetes Mellitus II Father   ? CAD Father   ? ? ?Social History ?Social History  ? ?Tobacco Use  ? Smoking status: Never  ?Substance Use Topics  ? Alcohol use: No  ? Drug use: No  ? ? ? ?  Allergies   ?Patient has no known allergies. ? ? ?Review of Systems ?Review of Systems ?Per HPI ? ?Physical Exam ?Triage Vital Signs ?ED Triage Vitals  ?Enc Vitals Group  ?   BP 06/01/21 1447 114/70  ?   Pulse Rate 06/01/21 1447 88  ?   Resp 06/01/21 1447 18  ?   Temp 06/01/21 1447 99.1 ?F (37.3 ?C)  ?   Temp Source 06/01/21 1447 Oral  ?   SpO2 06/01/21 1447 95 %  ?   Weight --   ?   Height --   ?   Head Circumference --   ?   Peak Flow --   ?   Pain Score 06/01/21 1448 4  ?   Pain Loc --   ?   Pain Edu? --   ?   Excl. in GC? --   ? ?No data found. ? ?Updated Vital Signs ?BP 114/70 (BP Location: Left Arm)   Pulse 88   Temp 99.1 ?F (37.3 ?C)  (Oral)   Resp 18   SpO2 95%  ? ?Visual Acuity ?Right Eye Distance:   ?Left Eye Distance:   ?Bilateral Distance:   ? ?Right Eye Near:   ?Left Eye Near:    ?Bilateral Near:    ? ?Physical Exam ?Constitutional:   ?   General: She is not in acute distress. ?   Appearance: Normal appearance. She is not toxic-appearing or diaphoretic.  ?HENT:  ?   Head: Normocephalic and atraumatic.  ?Eyes:  ?   Extraocular Movements: Extraocular movements intact.  ?   Conjunctiva/sclera: Conjunctivae normal.  ?Cardiovascular:  ?   Rate and Rhythm: Normal rate and regular rhythm.  ?   Pulses: Normal pulses.  ?   Heart sounds: Normal heart sounds.  ?Pulmonary:  ?   Effort: Pulmonary effort is normal. No respiratory distress.  ?   Breath sounds: Normal breath sounds.  ?Abdominal:  ?   General: Bowel sounds are normal. There is no distension.  ?   Palpations: Abdomen is soft.  ?   Tenderness: There is no abdominal tenderness.  ?Neurological:  ?   General: No focal deficit present.  ?   Mental Status: She is alert and oriented to person, place, and time. Mental status is at baseline.  ?Psychiatric:     ?   Mood and Affect: Mood normal.     ?   Behavior: Behavior normal.     ?   Thought Content: Thought content normal.     ?   Judgment: Judgment normal.  ? ? ? ?UC Treatments / Results  ?Labs ?(all labs ordered are listed, but only abnormal results are displayed) ?Labs Reviewed - No data to display ? ?EKG ? ? ?Radiology ?No results found. ? ?Procedures ?Procedures (including critical care time) ? ?Medications Ordered in UC ?Medications - No data to display ? ?Initial Impression / Assessment and Plan / UC Course  ?I have reviewed the triage vital signs and the nursing notes. ? ?Pertinent labs & imaging results that were available during my care of the patient were reviewed by me and considered in my medical decision making (see chart for details). ? ?  ? ?Patient's physical exam appears benign.  Bowel sounds are normal.  Patient had bowel  movement today so no concern for constipation, bowel obstruction, or diverticulitis complication at this time.  Vital signs are also stable and no fever is present.  Made recommendations for constipation medications that she can  take over-the-counter with chronic history of diverticulitis.  Also recommended gastrointestinal consult with provided contact information to follow-up for further evaluation and management given chronicity of issue.  Advised patient to go to the ER if symptoms persist or worsen.  Patient provided with work note.  Patient verbalized understanding and was agreeable with plan. ?Final Clinical Impressions(s) / UC Diagnoses  ? ?Final diagnoses:  ?Constipation, unspecified constipation type  ? ? ? ?Discharge Instructions   ? ?  ?Please go to the emergency department if symptoms persist or worsen.  You may take over-the-counter laxatives as needed.  Please follow directions on bottle or box.  MiraLAX will probably be the safest that you can get over-the-counter.  Please follow-up with gastrointestinal specialist with provided contact information for further evaluation and management as well ? ? ? ?ED Prescriptions   ?None ?  ? ?PDMP not reviewed this encounter. ?  ?Gustavus Bryant, [place] ?06/01/21 1528 ? ?  ?Gustavus Bryant, [place] ?06/01/21 1529 ? ?

## 2021-06-01 NOTE — Discharge Instructions (Addendum)
Please go to the emergency department if symptoms persist or worsen.  You may take over-the-counter laxatives as needed.  Please follow directions on bottle or box.  MiraLAX will probably be the safest that you can get over-the-counter.  Please follow-up with gastrointestinal specialist with provided contact information for further evaluation and management as well ?

## 2021-06-01 NOTE — ED Triage Notes (Signed)
Pt sts abd pain and cramping with hx of constipation; pt sts her job causes her not to be able to have potty breaks ?

## 2022-09-02 ENCOUNTER — Emergency Department (HOSPITAL_COMMUNITY): Payer: Medicaid Other

## 2022-09-02 ENCOUNTER — Other Ambulatory Visit: Payer: Self-pay

## 2022-09-02 ENCOUNTER — Emergency Department (HOSPITAL_COMMUNITY)
Admission: EM | Admit: 2022-09-02 | Discharge: 2022-09-02 | Disposition: A | Discharge status: Home / Self Care | Payer: Medicaid Other | Attending: Emergency Medicine | Admitting: Emergency Medicine

## 2022-09-02 DIAGNOSIS — R1030 Lower abdominal pain, unspecified: Secondary | ICD-10-CM | POA: Diagnosis present

## 2022-09-02 DIAGNOSIS — K5900 Constipation, unspecified: Secondary | ICD-10-CM | POA: Diagnosis not present

## 2022-09-02 LAB — COMPREHENSIVE METABOLIC PANEL
ALT: 12 U/L (ref 0–44)
AST: 20 U/L (ref 15–41)
Albumin: 4.4 g/dL (ref 3.5–5.0)
Alkaline Phosphatase: 76 U/L (ref 38–126)
Anion gap: 8 (ref 5–15)
BUN: 24 mg/dL — ABNORMAL HIGH (ref 6–20)
CO2: 23 mmol/L (ref 22–32)
Calcium: 9.4 mg/dL (ref 8.9–10.3)
Chloride: 103 mmol/L (ref 98–111)
Creatinine, Ser: 1.07 mg/dL — ABNORMAL HIGH (ref 0.44–1.00)
GFR, Estimated: 59 mL/min — ABNORMAL LOW (ref >60)
Glucose, Bld: 133 mg/dL — ABNORMAL HIGH (ref 70–99)
Potassium: 3.6 mmol/L (ref 3.5–5.1)
Sodium: 134 mmol/L — ABNORMAL LOW (ref 135–145)
Total Bilirubin: 0.4 mg/dL (ref 0.3–1.2)
Total Protein: 8 g/dL (ref 6.5–8.1)

## 2022-09-02 LAB — URINALYSIS, ROUTINE W REFLEX MICROSCOPIC
Bacteria, UA: NONE SEEN
Bilirubin Urine: NEGATIVE
Glucose, UA: NEGATIVE mg/dL
Hgb urine dipstick: NEGATIVE
Ketones, ur: NEGATIVE mg/dL
Nitrite: NEGATIVE
Protein, ur: NEGATIVE mg/dL
Specific Gravity, Urine: >1.046 — ABNORMAL HIGH (ref 1.005–1.030)
pH: 7 (ref 5.0–8.0)

## 2022-09-02 LAB — I-STAT CHEM 8, ED
BUN: 27 mg/dL — ABNORMAL HIGH (ref 6–20)
Calcium, Ion: 1.21 mmol/L (ref 1.15–1.40)
Chloride: 105 mmol/L (ref 98–111)
Creatinine, Ser: 1 mg/dL (ref 0.44–1.00)
Glucose, Bld: 134 mg/dL — ABNORMAL HIGH (ref 70–99)
HCT: 40 % (ref 36.0–46.0)
Hemoglobin: 13.6 g/dL (ref 12.0–15.0)
Potassium: 3.7 mmol/L (ref 3.5–5.1)
Sodium: 140 mmol/L (ref 135–145)
TCO2: 24 mmol/L (ref 22–32)

## 2022-09-02 LAB — CBC WITH DIFFERENTIAL/PLATELET
Abs Immature Granulocytes: 0.01 10*3/uL (ref 0.00–0.07)
Basophils Absolute: 0.1 10*3/uL (ref 0.0–0.1)
Basophils Relative: 1 %
Eosinophils Absolute: 0.3 10*3/uL (ref 0.0–0.5)
Eosinophils Relative: 3 %
HCT: 40 % (ref 36.0–46.0)
Hemoglobin: 13.1 g/dL (ref 12.0–15.0)
Immature Granulocytes: 0 %
Lymphocytes Relative: 42 %
Lymphs Abs: 3.8 10*3/uL (ref 0.7–4.0)
MCH: 29.7 pg (ref 26.0–34.0)
MCHC: 32.8 g/dL (ref 30.0–36.0)
MCV: 90.7 fL (ref 80.0–100.0)
Monocytes Absolute: 0.6 10*3/uL (ref 0.1–1.0)
Monocytes Relative: 6 %
Neutro Abs: 4.2 10*3/uL (ref 1.7–7.7)
Neutrophils Relative %: 48 %
Platelets: 254 10*3/uL (ref 150–400)
RBC: 4.41 MIL/uL (ref 3.87–5.11)
RDW: 13.2 % (ref 11.5–15.5)
WBC: 8.9 10*3/uL (ref 4.0–10.5)
nRBC: 0 % (ref 0.0–0.2)

## 2022-09-02 MED ORDER — PANTOPRAZOLE SODIUM 40 MG PO TBEC
40.0000 mg | DELAYED_RELEASE_TABLET | Freq: Once | ORAL | Status: AC
  Administered 2022-09-02: 40 mg via ORAL
  Filled 2022-09-02: qty 1

## 2022-09-02 MED ORDER — IOHEXOL 350 MG/ML SOLN
75.0000 mL | Freq: Once | INTRAVENOUS | Status: AC | PRN
  Administered 2022-09-02: 75 mL via INTRAVENOUS

## 2022-09-02 MED ORDER — MAGNESIUM CITRATE PO SOLN: 0.5000 | Freq: Once | ORAL | 0 refills | Status: AC

## 2022-09-02 NOTE — ED Provider Notes (Signed)
[place] EMERGENCY DEPARTMENT AT [facility] Provider Note  MDM   HPI/ROS:  Andrea Villegas is a 62 y.o. female with a medical history as below who presents for evaluation of abdominal pain.  She reports that she has been experiencing constipation for the last 3 days and has had only small bowel movements but continues to have lower abdominal pain.  She has taken Dulcolax only twice, and has not yet had a bowel movement.  She reports that this has happened before but it has improved with Dulcolax.  Physical exam is notable for: - Mild TTP in bilateral lower quadrants - Otherwise well-appearing  On my initial evaluation, patient is:  -Vital signs stable. Patient afebrile, hemodynamically stable, and non-toxic appearing. -Additional history obtained from chart review  This patient's current presentation, including their history and physical exam, is most consistent with constipation.  Presentation is not consistent with acute bowel obstruction from tumor or stricture, hernia, adhesion, volvulus or fecal impaction.  Low suspicion for etiology related any new medications including opiates.  Presentation is not consistent with any acute anorectal disorders.  Constipation reportedly is a chronic issue for her, however her her electrolytes appeared grossly normal as well as the remainder of her lab work.  CT scan ultimately reassuring did not show any evidence of acute bowel obstruction and labs have remained stable.  Will discharge the patient home with information for miralax cleanout and rx for magnesium citrate.    Disposition: Discharge   I discussed the plan for discharge with the patient and/or their surrogate at bedside prior to discharge and they were in agreement with the plan and verbalized understanding of the return precautions provided. All questions answered to the best of my ability. Ultimately, the patient was discharged in stable condition with stable vital signs. I am  reassured that they are capable of close follow up and good social support at home.   Clinical Impression:  1. Constipation, unspecified constipation type     Rx / DC Orders ED Discharge Orders          Ordered    magnesium citrate SOLN   Once        09/02/22 2127            The plan for this patient was discussed with Dr. Dalene Seltzer, who voiced agreement and who oversaw evaluation and treatment of this patient.   Clinical Complexity A medically appropriate history, review of systems, and physical exam was performed.  My independent interpretations of EKG, labs, and radiology are documented in the ED course above.   If decision rules were used in this patient's evaluation, they are listed below.   Click here for ABCD2, HEART and other calculatorsREFRESH Note before signing   Patient's presentation is most consistent with acute complicated illness / injury requiring diagnostic workup.  Medical Decision Making Amount and/or Complexity of Data Reviewed Labs:  Decision-making details documented in ED Course. Radiology: independent interpretation performed. Decision-making details documented in ED Course. ECG/medicine tests: ordered and independent interpretation performed. Decision-making details documented in ED Course.  Risk OTC drugs. Prescription drug management. Diagnosis or treatment significantly limited by social determinants of health.    HPI/ROS      See MDM section for pertinent HPI and ROS. A complete ROS was performed with pertinent positives/negatives noted above.   Past Medical History:  Diagnosis Date   Lung nodules    Medical history non-contributory     Past Surgical History:  Procedure Laterality Date  CHOLECYSTECTOMY     TUBAL LIGATION        Physical Exam   Vitals:   09/02/22 1541 09/02/22 1542 09/02/22 1920  BP: 137/72  107/71  Pulse: 80  73  Resp: 16  17  Temp: 98 F (36.7 C)  98.9 F (37.2 C)  TempSrc: Oral  Oral  SpO2: 97%   95%  Weight:  63 kg   Height:  5' (1.524 m)     Physical Exam Vitals and nursing note reviewed.  Constitutional:      General: She is not in acute distress.    Appearance: She is well-developed.  HENT:     Head: Normocephalic and atraumatic.  Eyes:     Conjunctiva/sclera: Conjunctivae normal.  Cardiovascular:     Rate and Rhythm: Normal rate and regular rhythm.     Heart sounds: No murmur heard. Pulmonary:     Effort: Pulmonary effort is normal. No respiratory distress.     Breath sounds: Normal breath sounds.  Abdominal:     General: Abdomen is flat. Bowel sounds are normal. There is no distension.     Palpations: Abdomen is soft. There is no mass or pulsatile mass.     Tenderness: There is abdominal tenderness in the right lower quadrant, suprapubic area and left lower quadrant. There is no guarding or rebound. Negative signs include obturator sign.     Hernia: No hernia is present.  Musculoskeletal:        General: No swelling.     Cervical back: Neck supple.  Skin:    General: Skin is warm and dry.     Capillary Refill: Capillary refill takes less than 2 seconds.  Neurological:     Mental Status: She is alert.  Psychiatric:        Mood and Affect: Mood normal.      Procedures   If procedures were preformed on this patient, they are listed below:  Procedures   [name], MD Emergency Medicine PGY-2   Please note that this documentation was produced with the assistance of voice-to-text technology and may contain errors.    [name], MD 09/02/22 1610    Alvira Monday, MD 09/05/22 2140

## 2022-09-02 NOTE — Discharge Instructions (Signed)
You are seen today for abdominal pain.  While you were here we got labs, monitored your vital signs, performed a physical exam, got CT imaging of your abdomen.  All these were reassuring and did not show any need for any further tests or interventions in the emergency department.  Your abdominal pain is most likely due to constipation.  Things to do: 1 - I sent a prescription for magnesium citrate to the pharmacy, you can use 1/2-1 bottle for constipation 2 - Start using MiraLAX on a daily basis 3 - Follow-up with your primary care doctor soon as you are able 4 - Return to the emergency department if you have any new or worsening abdominal pain, continues to have constipation despite medications, or if you have any other concerns

## 2022-09-02 NOTE — ED Triage Notes (Signed)
Pt to ED c/o constipation x 3 days.  HX: Diverticulitis .

## 2022-09-02 NOTE — ED Provider Triage Note (Signed)
Emergency Medicine Provider Triage Evaluation Note  Andrea Villegas , a 62 y.o. female  was evaluated in triage.  Pt complains of constipation.  Review of Systems  Positive:  Negative:   Physical Exam  BP 137/72 (BP Location: Right Arm)   Pulse 80   Temp 98 F (36.7 C) (Oral)   Resp 16   Ht 5' (1.524 m)   Wt 63 kg   SpO2 97%   BMI 27.15 kg/m  Gen:   Awake, no distress   Resp:  Normal effort  MSK:   Moves extremities without difficulty  Other:    Medical Decision Making  Medically screening exam initiated at 4:07 PM.  Appropriate orders placed.  Andrea Villegas was informed that the remainder of the evaluation will be completed by another provider, this initial triage assessment does not replace that evaluation, and the importance of remaining in the ED until their evaluation is complete.  Hx constipation. Complaining of constipation x3 days and lower back pain that intermittently radiates down to right leg. Only abdominal surgery was c-section 25 years ago. Hx diverticulitis 2018 and had the same symptoms. Has tried bisacodyl without relief in symptoms. Usually bisacodyl helps.  Denies fevers, chest pain, dyspnea, nausea, vomiting, dysuria, hematuria.   Dorthy Cooler, [place] 09/02/22 1616

## 2022-09-02 NOTE — ED Provider Notes (Incomplete)
  [name] EMERGENCY DEPARTMENT AT [facility] Provider Note  MDM   HPI/ROS:  Andrea Villegas is a 62 y.o. female with a medical history as below who presents ***   Physical exam is notable for: -***  On my initial evaluation, patient is:  -Vital signs stable.*** Patient afebrile***, hemodynamically stable***, and non-toxic appearing.*** -Additional history obtained from ***  This patient's current presentation, including their history and physical exam, is most consistent with ***. Differentials include ***.     Interpretations, interventions, and the patient's course of care are documented below.      ***   Disposition:  {ED Dispo:29898}  Clinical Impression: No diagnosis found.  Rx / DC Orders ED Discharge Orders     None       The plan for this patient was discussed with Dr. ***, who voiced agreement and who oversaw evaluation and treatment of this patient.   Clinical Complexity A medically appropriate history, review of systems, and physical exam was performed.  My independent interpretations of EKG, labs, and radiology are documented in the ED course above.   If decision rules were used in this patient's evaluation, they are listed below.  *** Click here for ABCD2, HEART and other calculatorsREFRESH Note before signing   Patient's presentation is most consistent with {EM COPA:27473}  Medical Decision Making   HPI/ROS      See MDM section for pertinent HPI and ROS. A complete ROS was performed with pertinent positives/negatives noted above.   Past Medical History:  Diagnosis Date   Lung nodules    Medical history non-contributory     Past Surgical History:  Procedure Laterality Date   CHOLECYSTECTOMY     TUBAL LIGATION        Physical Exam   Vitals:   09/02/22 1541 09/02/22 1542 09/02/22 1920  BP: 137/72  107/71  Pulse: 80  73  Resp: 16  17  Temp: 98 F (36.7 C)  98.9 F (37.2 C)  TempSrc: Oral  Oral  SpO2: 97%  95%   Weight:  63 kg   Height:  5' (1.524 m)     Physical Exam   Procedures   If procedures were preformed on this patient, they are listed below:  Procedures   [name], MD Emergency Medicine PGY-2   Please note that this documentation was produced with the assistance of voice-to-text technology and may contain errors.

## 2022-10-30 ENCOUNTER — Other Ambulatory Visit: Payer: Self-pay

## 2022-10-30 ENCOUNTER — Emergency Department (HOSPITAL_COMMUNITY)
Admission: EM | Admit: 2022-10-30 | Discharge: 2022-10-30 | Disposition: A | Discharge status: Home / Self Care | Payer: Medicaid Other | Attending: Emergency Medicine | Admitting: Emergency Medicine

## 2022-10-30 DIAGNOSIS — N3001 Acute cystitis with hematuria: Secondary | ICD-10-CM | POA: Diagnosis not present

## 2022-10-30 DIAGNOSIS — R300 Dysuria: Secondary | ICD-10-CM | POA: Diagnosis present

## 2022-10-30 LAB — URINALYSIS, ROUTINE W REFLEX MICROSCOPIC
Bilirubin Urine: NEGATIVE
Glucose, UA: NEGATIVE mg/dL
Ketones, ur: NEGATIVE mg/dL
Nitrite: POSITIVE — AB
Protein, ur: NEGATIVE mg/dL
Specific Gravity, Urine: >1.03 — ABNORMAL HIGH (ref 1.005–1.030)
pH: 6 (ref 5.0–8.0)

## 2022-10-30 LAB — COMPREHENSIVE METABOLIC PANEL
ALT: 16 U/L (ref 0–44)
AST: 18 U/L (ref 15–41)
Albumin: 4 g/dL (ref 3.5–5.0)
Alkaline Phosphatase: 78 U/L (ref 38–126)
Anion gap: 13 (ref 5–15)
BUN: 19 mg/dL (ref 6–20)
CO2: 27 mmol/L (ref 22–32)
Calcium: 9.6 mg/dL (ref 8.9–10.3)
Chloride: 101 mmol/L (ref 98–111)
Creatinine, Ser: 1.22 mg/dL — ABNORMAL HIGH (ref 0.44–1.00)
GFR, Estimated: 51 mL/min — ABNORMAL LOW (ref >60)
Glucose, Bld: 91 mg/dL (ref 70–99)
Potassium: 3.7 mmol/L (ref 3.5–5.1)
Sodium: 141 mmol/L (ref 135–145)
Total Bilirubin: 0.6 mg/dL (ref 0.3–1.2)
Total Protein: 8 g/dL (ref 6.5–8.1)

## 2022-10-30 LAB — CBC
HCT: 42 % (ref 36.0–46.0)
Hemoglobin: 13.7 g/dL (ref 12.0–15.0)
MCH: 30.1 pg (ref 26.0–34.0)
MCHC: 32.6 g/dL (ref 30.0–36.0)
MCV: 92.3 fL (ref 80.0–100.0)
Platelets: 267 10*3/uL (ref 150–400)
RBC: 4.55 MIL/uL (ref 3.87–5.11)
RDW: 13.2 % (ref 11.5–15.5)
WBC: 9 10*3/uL (ref 4.0–10.5)
nRBC: 0 % (ref 0.0–0.2)

## 2022-10-30 LAB — URINALYSIS, MICROSCOPIC (REFLEX): WBC, UA: >50 WBC/hpf (ref 0–5)

## 2022-10-30 MED ORDER — CEPHALEXIN 500 MG PO CAPS: 500.0000 mg | ORAL_CAPSULE | Freq: Four times a day (QID) | ORAL | 0 refills | Status: DC

## 2022-10-30 MED ORDER — SODIUM CHLORIDE 0.9 % IV SOLN
1.0000 g | Freq: Once | INTRAVENOUS | Status: AC
  Administered 2022-10-30: 1 g via INTRAVENOUS
  Filled 2022-10-30: qty 10

## 2022-10-30 NOTE — ED Triage Notes (Addendum)
Pt reports dysuria and urinary frequency. Reports chills 2 days ago. Pt also reporting she has had vaginal bleeding.

## 2022-10-30 NOTE — ED Provider Notes (Signed)
[place] EMERGENCY DEPARTMENT AT [facility] Provider Note   CSN: [id number] Arrival date & time: 10/30/22  1646     History  Chief Complaint  Patient presents with   Dysuria    Shanelly Delvillar is a 62 y.o. female.  This is a 62 year old female who presents emergency department today due to dysuria of a few days duration.  Patient says that she has noticed that she has been going to the bathroom more frequently and feels "weird" when she urinates.  Says that about a week ago she did have 1 episode of vaginal bleeding.  No abdominal pain.   Dysuria      Home Medications Prior to Admission medications   Medication Sig Start Date End Date Taking? Authorizing Provider  cephALEXin (KEFLEX) 500 MG capsule Take 1 capsule (500 mg total) by mouth 4 (four) times daily. 10/30/22  Yes Anders Simmonds T, DO  acetaminophen (TYLENOL) 500 MG tablet Take 2 tablets (1,000 mg total) by mouth every 8 (eight) hours as needed. Patient not taking: Reported on 11/03/2020 10/21/17   McDonald, Mia A, PA-C  HYDROcodone-homatropine (HYCODAN) 5-1.5 MG/5ML syrup Take 5 mLs by mouth every 6 (six) hours as needed for cough. Patient not taking: Reported on 11/03/2020 01/10/15   Patel-Mills, Lorelle [place], PA-C  hydrOXYzine (ATARAX) 25 MG tablet Take 1 tablet (25 mg total) by mouth daily as needed ([facility]). 05/24/21   Tomi Bamberger, PA-C  ibuprofen (ADVIL,MOTRIN) 600 MG tablet Take 1 tablet (600 mg total) by mouth every 6 (six) hours as needed. Patient not taking: Reported on 11/03/2020 10/21/17   McDonald, Mia A, PA-C  naproxen sodium (ALEVE) 220 MG tablet Take 440 mg by mouth daily as needed (pain/headache).    [provider]  ondansetron (ZOFRAN) 4 MG tablet Take 1 tablet (4 mg total) by mouth every 6 (six) hours. Patient not taking: Reported on 11/03/2020 03/23/16   Long, Arlyss Repress, MD  oxyCODONE-acetaminophen (PERCOCET/ROXICET) 5-325 MG tablet Take 2 tablets by mouth every 4 (four) hours as needed for  severe pain. Patient not taking: Reported on 11/03/2020 03/23/16   Long, Arlyss Repress, MD      Allergies    Patient has no known allergies.    Review of Systems   Review of Systems  Genitourinary:  Positive for dysuria.    Physical Exam Updated Vital Signs BP 103/64   Pulse 81   Temp 98.8 F (37.1 C) (Oral)   Resp 17   SpO2 97%  Physical Exam Vitals reviewed.  HENT:     Head: Normocephalic.  Cardiovascular:     Rate and Rhythm: Normal rate.  Pulmonary:     Effort: Pulmonary effort is normal.  Abdominal:     General: Abdomen is flat. There is no distension.     Palpations: Abdomen is soft.     Tenderness: There is abdominal tenderness.     Comments: Suprapubic and left lower quadrant tenderness     ED Results / Procedures / Treatments   Labs (all labs ordered are listed, but only abnormal results are displayed) Labs Reviewed  COMPREHENSIVE METABOLIC PANEL - Abnormal; Notable for the following components:      Result Value   Creatinine, Ser 1.22 (*)    GFR, Estimated 51 (*)    All other components within normal limits  URINALYSIS, ROUTINE W REFLEX MICROSCOPIC - Abnormal; Notable for the following components:   Specific Gravity, Urine >1.030 (*)    Hgb urine dipstick SMALL (*)  Nitrite POSITIVE (*)    Leukocytes,Ua LARGE (*)    All other components within normal limits  URINALYSIS, MICROSCOPIC (REFLEX) - Abnormal; Notable for the following components:   Bacteria, UA RARE (*)    All other components within normal limits  CBC    EKG None  Radiology No results found.  Procedures Procedures    Medications Ordered in ED Medications  cefTRIAXone (ROCEPHIN) 1 g in sodium chloride 0.9 % 100 mL IVPB (1 g Intravenous New Bag/Given 10/30/22 1823)    ED Course/ Medical Decision Making/ A&P                                 Medical Decision Making 10-year-old female here today for dysuria, suprapubic and left lower abdominal pain.  Differential diagnoses include  cystitis, less likely diverticulitis, vaginal bleeding.  Plan-will check urinalysis on the patient.  If positive, believe that this would explain the majority of the patient's symptoms from an emergency department perspective, with likely referral to OB/GYN for her episode of vaginal bleeding is a concern would be in a postmenopausal patient for endometrial CA.  Patient's abdomen overall soft.  Reassessment-nitrate positive UTI.  Will treat with Keflex.  Give a dose of Rocephin here.  I will the patient follow-up with her PCP.  Amount and/or Complexity of Data Reviewed Labs: ordered.           Final Clinical Impression(s) / ED Diagnoses Final diagnoses:  Acute cystitis with hematuria    Rx / DC Orders ED Discharge Orders          Ordered    cephALEXin (KEFLEX) 500 MG capsule  4 times daily        10/30/22 1834              Anders Simmonds T, DO 10/30/22 1835

## 2022-10-30 NOTE — Discharge Instructions (Addendum)
You have a UTI.  You can take Keflex 4 times per day for the next 1 week.  Follow-up with your primary care doctor this week.  Return to the emergency department if you develop fever, inability to eat or drink.

## 2023-01-18 ENCOUNTER — Encounter (HOSPITAL_COMMUNITY): Payer: Self-pay | Admitting: *Deleted

## 2023-01-18 ENCOUNTER — Other Ambulatory Visit: Payer: Self-pay

## 2023-01-18 ENCOUNTER — Emergency Department (HOSPITAL_COMMUNITY): Payer: Medicaid Other

## 2023-01-18 ENCOUNTER — Emergency Department (HOSPITAL_COMMUNITY)
Admission: EM | Admit: 2023-01-18 | Discharge: 2023-01-19 | Disposition: A | Discharge status: Home / Self Care | Payer: Medicaid Other | Attending: Emergency Medicine | Admitting: Emergency Medicine

## 2023-01-18 DIAGNOSIS — H9202 Otalgia, left ear: Secondary | ICD-10-CM | POA: Diagnosis present

## 2023-01-18 DIAGNOSIS — R519 Headache, unspecified: Secondary | ICD-10-CM | POA: Diagnosis not present

## 2023-01-18 DIAGNOSIS — H60512 Acute actinic otitis externa, left ear: Secondary | ICD-10-CM | POA: Insufficient documentation

## 2023-01-18 DIAGNOSIS — H60502 Unspecified acute noninfective otitis externa, left ear: Secondary | ICD-10-CM

## 2023-01-18 NOTE — ED Triage Notes (Signed)
Left ear pain three days ago, had some bleeding from it. Also has had a headache that occurred 3 days ago "for a half a minute" that has subsided.

## 2023-01-18 NOTE — ED Provider Triage Note (Signed)
Emergency Medicine Provider Triage Evaluation Note  Andrea Villegas , a 62 y.o. female  was evaluated in triage.  Pt complains of headache 3 days ago.  Has not had 1 since.  Yesterday developed bleeding from the left nostril which is not abnormal for her.  Today she noticed a sensation of discharge to her left ear so she put a Q-tip in the ear and found bright red blood.  She is currently asymptomatic.  Review of Systems  Positive: As above Negative: As above  Physical Exam  BP 113/70   Pulse 72   Temp 98.2 F (36.8 C)   Resp 16   SpO2 98%  Gen:   Awake, no distress   Resp:  Normal effort  MSK:   Moves extremities without difficulty  Other:  Small area of maceration and scant purulent discharge just inside the auditory canal on the left  Medical Decision Making  Medically screening exam initiated at 9:53 PM.  Appropriate orders placed.  Andrea Villegas was informed that the remainder of the evaluation will be completed by another provider, this initial triage assessment does not replace that evaluation, and the importance of remaining in the ED until their evaluation is complete.  Workup initiated   Mora Bellman 01/18/23 2154

## 2023-01-19 MED ORDER — NEOMYCIN-POLYMYXIN-HC 3.5-10000-1 OT SUSP: 4.0000 [drp] | Freq: Three times a day (TID) | OTIC | 0 refills | Status: AC

## 2023-01-19 NOTE — Discharge Instructions (Addendum)
Have an ear infection on the left.  Antibiotic eardrops sent into the pharmacy for you.  Keep the ear dry.  Follow-up with the PCP.  Clinic listed above.  Incidental benign finding on your CT.  Listed below.  Discussed this with the PCP that you establish.  2. 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 Correlation  with nonemergent facial bone CT is recommended.      Electronically Signed

## 2023-01-19 NOTE — ED Provider Notes (Signed)
[place] EMERGENCY DEPARTMENT AT [facility] Provider Note   CSN: [id number] Arrival date & time: 01/18/23  2116     History  Chief Complaint  Patient presents with   Headache   Otalgia    Andrea Villegas is a 62 y.o. female.  61 year old female presents today for concern of left ear pain.  Noticed some discharge as well.  She states she also had 1 episode of small amounts of blood from the left ear as well.  No other complaints.  No fever.  The history is provided by the patient. No language interpreter was used.       Home Medications Prior to Admission medications   Medication Sig Start Date End Date Taking? Authorizing Provider  neomycin-polymyxin-hydrocortisone (CORTISPORIN) 3.5-10000-1 OTIC suspension Place 4 drops into the left ear 3 (three) times daily for 7 days. 01/19/23 01/26/23 Yes Lunna Vogelgesang, PA-C  acetaminophen (TYLENOL) 500 MG tablet Take 2 tablets (1,000 mg total) by mouth every 8 (eight) hours as needed. Patient not taking: Reported on 11/03/2020 10/21/17   McDonald, Pedro Earls A, PA-C  cephALEXin (KEFLEX) 500 MG capsule Take 1 capsule (500 mg total) by mouth 4 (four) times daily. 10/30/22   Arletha Pili, DO  HYDROcodone-homatropine (HYCODAN) 5-1.5 MG/5ML syrup Take 5 mLs by mouth every 6 (six) hours as needed for cough. Patient not taking: Reported on 11/03/2020 01/10/15   Patel-Mills, Lorelle Formosa, PA-C  hydrOXYzine (ATARAX) 25 MG tablet Take 1 tablet (25 mg total) by mouth daily as needed (Insomina). 05/24/21   Tomi Bamberger, PA-C  ibuprofen (ADVIL,MOTRIN) 600 MG tablet Take 1 tablet (600 mg total) by mouth every 6 (six) hours as needed. Patient not taking: Reported on 11/03/2020 10/21/17   McDonald, Mia A, PA-C  naproxen sodium (ALEVE) 220 MG tablet Take 440 mg by mouth daily as needed (pain/headache).    [provider]  ondansetron (ZOFRAN) 4 MG tablet Take 1 tablet (4 mg total) by mouth every 6 (six) hours. Patient not taking: Reported on 11/03/2020  03/23/16   Long, Arlyss Repress, MD  oxyCODONE-acetaminophen (PERCOCET/ROXICET) 5-325 MG tablet Take 2 tablets by mouth every 4 (four) hours as needed for severe pain. Patient not taking: Reported on 11/03/2020 03/23/16   Long, Arlyss Repress, MD      Allergies    Patient has no known allergies.    Review of Systems   Review of Systems  Constitutional:  Negative for chills and fever.  HENT:  Positive for ear discharge and ear pain.   Neurological:  Negative for headaches.  All other systems reviewed and are negative.   Physical Exam Updated Vital Signs BP 117/85 (BP Location: Right Arm)   Pulse 69   Temp 98.1 F (36.7 C) (Oral)   Resp 16   SpO2 99%  Physical Exam Vitals and nursing note reviewed.  Constitutional:      General: She is not in acute distress.    Appearance: Normal appearance. She is not ill-appearing.  HENT:     Head: Normocephalic and atraumatic.     Ears:     Comments: Purulent drainage in the left ear canal noted.  Some erythema as well.  Consistent with otitis externa.  Right ear canal without signs of infection.  No evidence of otitis media bilaterally.    Nose: Nose normal.  Eyes:     Conjunctiva/sclera: Conjunctivae normal.  Cardiovascular:     Rate and Rhythm: Normal rate.  Pulmonary:     Effort: Pulmonary effort is  normal. No respiratory distress.  Musculoskeletal:        General: No deformity.  Skin:    Findings: No rash.  Neurological:     Mental Status: She is alert.     ED Results / Procedures / Treatments   Labs (all labs ordered are listed, but only abnormal results are displayed) Labs Reviewed - No data to display  EKG None  Radiology CT Head Wo Contrast  Result Date: 01/18/2023 CLINICAL DATA:  Headache. EXAM: CT HEAD WITHOUT CONTRAST TECHNIQUE: Contiguous axial images were obtained from the base of the skull through the vertex without intravenous contrast. RADIATION DOSE REDUCTION: This exam was performed according to the departmental  dose-optimization program which includes automated exposure control, adjustment of the mA and/or kV according to patient size and/or use of iterative reconstruction technique. COMPARISON:  None Available. FINDINGS: Brain: No evidence of acute infarction, hemorrhage, hydrocephalus, extra-axial collection or mass lesion/mass effect. Vascular: Mild to moderate severity bilateral cavernous carotid artery calcification is seen. Skull: A 12 mm diameter round, expansile area is seen within the anterior aspect of the body of the maxilla, to the right of midline (axial CT image 1, CT series 3). Sinuses/Orbits: No acute finding. Other: None. IMPRESSION: 1. No acute intracranial abnormality. 2. 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 Correlation with nonemergent facial bone CT is recommended. Electronically Signed   By: Aram Candela M.D.   On: 01/18/2023 23:12    Procedures Procedures    Medications Ordered in ED Medications - No data to display  ED Course/ Medical Decision Making/ A&P                                 Medical Decision Making Risk Prescription drug management.   62 year old female presents today for concern of left ear pain.  CT head was obtained through triage.  Exam shows concern for otitis externa on the left.  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  No other acute process.  Cortisporin prescribed.  Return precaution discussed.  Patient voices understanding and is in agreement with plan.  PCP referral given.   Final Clinical Impression(s) / ED Diagnoses Final diagnoses:  Acute otitis externa of left ear, unspecified type    Rx / DC Orders ED Discharge Orders          Ordered    neomycin-polymyxin-hydrocortisone (CORTISPORIN) 3.5-10000-1 OTIC suspension  3 times daily        01/19/23 0555              Marita Kansas, PA-C 01/19/23 0604     Zadie Rhine, MD 01/19/23 [phone]

## 2023-04-19 ENCOUNTER — Encounter: Payer: Medicaid Other | Admitting: Medical

## 2023-08-02 ENCOUNTER — Other Ambulatory Visit (HOSPITAL_COMMUNITY)
Admission: RE | Admit: 2023-08-02 | Discharge: 2023-08-02 | Disposition: A | Discharge status: Home / Self Care | Source: Ambulatory Visit | Attending: Obstetrics and Gynecology | Admitting: Obstetrics and Gynecology

## 2023-08-02 ENCOUNTER — Encounter: Payer: Self-pay | Admitting: Obstetrics and Gynecology

## 2023-08-02 ENCOUNTER — Ambulatory Visit: Admitting: Obstetrics and Gynecology

## 2023-08-02 VITALS — BP 109/67 | HR 86 | Ht 60.0 in | Wt 147.0 lb

## 2023-08-02 DIAGNOSIS — Z1231 Encounter for screening mammogram for malignant neoplasm of breast: Secondary | ICD-10-CM | POA: Diagnosis not present

## 2023-08-02 DIAGNOSIS — Z124 Encounter for screening for malignant neoplasm of cervix: Secondary | ICD-10-CM | POA: Diagnosis not present

## 2023-08-02 DIAGNOSIS — N393 Stress incontinence (female) (male): Secondary | ICD-10-CM | POA: Diagnosis not present

## 2023-08-02 DIAGNOSIS — Z01419 Encounter for gynecological examination (general) (routine) without abnormal findings: Secondary | ICD-10-CM | POA: Diagnosis not present

## 2023-08-02 DIAGNOSIS — Z1331 Encounter for screening for depression: Secondary | ICD-10-CM

## 2023-08-02 NOTE — Progress Notes (Signed)
 ANNUAL GYNECOLOGY VISIT Chief Complaint  Patient presents with   New Patient (Initial Visit)   Annual Exam     Subjective:  Andrea Villegas is a 63 y.o. [phone] who presents for annual exam.  She reports that she previously saw a doctor in [place]  who told her that she should have testing done. Patient is unsure what the testing was because she said that she never went to have it done. She thinks it may have been to see if she is at risk for worsening urinary leakage and there was talk about mesh and hysterectomy. She is asking me about the testing and what it is and whether she should have it done.  She reports occasional leakage with coughing when she gets sick. Otherwise no significant symptoms.   Last pap: No results found for: DIAGPAP, HPV, HPVHIGH, years ago History of abnormal pap: not to her knowledge Last mammogram: years ago Last colonoscopy: 2025, reportedly normal Last DEXA: never, denies risk factors      08/02/2023    2:12 PM  Depression screen PHQ 2/9  Decreased Interest 3  Down, Depressed, Hopeless 2  PHQ - 2 Score 5  Altered sleeping 2  Tired, decreased energy 2  Change in appetite 2  Feeling bad or failure about yourself  0  Trouble concentrating 1  Moving slowly or fidgety/restless 0  Suicidal thoughts 0  PHQ-9 Score 12        08/02/2023    2:12 PM  GAD 7 : Generalized Anxiety Score  Nervous, Anxious, on Edge 1  Control/stop worrying 1  Worry too much - different things 1  Trouble relaxing 1  Restless 0  Easily annoyed or irritable 0  Afraid - awful might happen 0  Total GAD 7 Score 4      OB History     Gravida  6   Para      Term      Preterm      AB  1   Living  5      SAB  1   IAB      Ectopic      Multiple      Live Births              Past Medical History:  Diagnosis Date   Lung nodules    Medical history non-contributory     Past Surgical History:  Procedure Laterality Date   CESAREAN  SECTION     CHOLECYSTECTOMY     TUBAL LIGATION      Social History   Socioeconomic History   Marital status: Married    Spouse name: Not on file   Number of children: Not on file   Years of education: Not on file   Highest education level: Not on file  Occupational History   Not on file  Tobacco Use   Smoking status: Never   Smokeless tobacco: Not on file  Substance and Sexual Activity   Alcohol use: No   Drug use: No   Sexual activity: Yes    Birth control/protection: None  Other Topics Concern   Not on file  Social History Narrative   Not on file   Social Drivers of Health   Financial Resource Strain: Not on file  Food Insecurity: Not on file  Transportation Needs: Not on file  Physical Activity: Not on file  Stress: Not on file  Social Connections: Unknown (06/28/2021)   Received from [facility]   Social  Network    Social Network: Not on file    Family History  Problem Relation Age of Onset   Diabetes Mellitus II Father    CAD Father     Current Outpatient Medications on File Prior to Visit  Medication Sig Dispense Refill   acetaminophen  (TYLENOL ) 500 MG tablet Take 2 tablets (1,000 mg total) by mouth every 8 (eight) hours as needed. (Patient not taking: Reported on 08/02/2023) 30 tablet 0   hydrOXYzine  (ATARAX ) 25 MG tablet Take 1 tablet (25 mg total) by mouth daily as needed (Insomina). (Patient not taking: Reported on 08/02/2023) 30 tablet 0   ibuprofen  (ADVIL ,MOTRIN ) 600 MG tablet Take 1 tablet (600 mg total) by mouth every 6 (six) hours as needed. (Patient not taking: Reported on 08/02/2023) 30 tablet 0   naproxen sodium (ALEVE) 220 MG tablet Take 440 mg by mouth daily as needed (pain/headache). (Patient not taking: Reported on 08/02/2023)     No current facility-administered medications on file prior to visit.    No Known Allergies   Objective:   Vitals:   08/02/23 1355  BP: 109/67  Pulse: 86  Weight: 147 lb (66.7 kg)  Height: 5' (1.524 m)    Physical Examination:   General appearance - well appearing, and in no distress  Mental status - alert, oriented to person, place, and time  Psych:  normal mood and affect  Skin - warm and dry, normal color, no suspicious lesions noted  Chest - effort normal, all lung fields clear to auscultation bilaterally  Heart - normal rate and regular rhythm  Neck:  midline trachea, no thyromegaly or nodules  Breasts - breasts appear normal, no suspicious masses, no skin or nipple changes or  axillary nodes  Abdomen - soft, nontender, nondistended, no masses or organomegaly  Pelvic -  VULVA: normal appearing vulva with no masses, tenderness or lesions, atrophic   VAGINA: normal appearing vagina with normal color and discharge, no lesions, atrophic  CERVIX: normal appearing cervix without discharge or lesions, no CMT  Thin prep pap is done with HR HPV cotesting  UTERUS: uterus is felt to be normal size, shape, consistency and nontender   ADNEXA: No adnexal masses or tenderness noted.  Extremities:  No swelling or varicosities noted  Chaperone present for exam  Assessment and Plan:  1. Well woman exam with routine gynecological exam (Primary) Pap/HPV Mammo ordered Colonoscopy up to date DEXA age 60  2. Cervical cancer screening - Cytology - PAP( [place])  3. Encounter for screening mammogram for malignant neoplasm of breast - MM 3D SCREENING MAMMOGRAM BILATERAL BREAST; Future  4. Stress incontinence of urine She may have been offered urodynamics in the past but I am not completely sure what was discussed with her [place]  doctor. Mild symptoms however patient requesting further evaluation. Urogyn referral - Ambulatory referral to Urogynecology  5. Positive screening for depression on 9-item Patient Health Questionnaire (PHQ-9) Patient interested in referral - Ambulatory referral to Integrated Behavioral Health   Marci Setter, MD, FACOG Obstetrician & Gynecologist, [facility] for [facility], [facility]

## 2023-08-02 NOTE — Progress Notes (Signed)
 Pt states it has been 13+ years since last exam.  Pt states she was told in the past she needed some testing ?bladder/uterine.   Pt states she has very minimal stress incontinence.  PHQ9 score 12, GAD score 4.  Referral ordered if pt interested.

## 2023-08-04 ENCOUNTER — Ambulatory Visit: Payer: Self-pay | Admitting: Obstetrics and Gynecology

## 2023-08-04 LAB — CYTOLOGY - PAP
Comment: NEGATIVE
Diagnosis: NEGATIVE
High risk HPV: NEGATIVE

## 2023-08-07 ENCOUNTER — Ambulatory Visit: Payer: Self-pay | Admitting: Licensed Clinical Social Worker

## 2023-08-07 DIAGNOSIS — Z91199 Patient's noncompliance with other medical treatment and regimen due to unspecified reason: Secondary | ICD-10-CM

## 2023-08-10 NOTE — BH Specialist Note (Signed)
 [facility] sent virtual link to patient on this date and a phone call was provided. Patient no showed for today's visit.

## 2023-11-22 ENCOUNTER — Encounter: Payer: Self-pay | Admitting: Obstetrics

## 2023-11-22 ENCOUNTER — Ambulatory Visit (INDEPENDENT_AMBULATORY_CARE_PROVIDER_SITE_OTHER): Admitting: Obstetrics

## 2023-11-22 VITALS — BP 117/67 | HR 85 | Ht <= 58 in | Wt 145.0 lb

## 2023-11-22 DIAGNOSIS — N952 Postmenopausal atrophic vaginitis: Secondary | ICD-10-CM

## 2023-11-22 DIAGNOSIS — N941 Unspecified dyspareunia: Secondary | ICD-10-CM | POA: Diagnosis not present

## 2023-11-22 DIAGNOSIS — R351 Nocturia: Secondary | ICD-10-CM

## 2023-11-22 DIAGNOSIS — K5900 Constipation, unspecified: Secondary | ICD-10-CM

## 2023-11-22 DIAGNOSIS — N393 Stress incontinence (female) (male): Secondary | ICD-10-CM | POA: Diagnosis not present

## 2023-11-22 DIAGNOSIS — R3914 Feeling of incomplete bladder emptying: Secondary | ICD-10-CM

## 2023-11-22 LAB — POCT URINALYSIS DIP (CLINITEK)
Bilirubin, UA: NEGATIVE
Blood, UA: NEGATIVE
Glucose, UA: NEGATIVE mg/dL
Leukocytes, UA: NEGATIVE
Nitrite, UA: NEGATIVE
POC PROTEIN,UA: 30 — AB
Spec Grav, UA: 1.025 (ref 1.010–1.025)
Urobilinogen, UA: 0.2 U/dL
pH, UA: 6 (ref 5.0–8.0)

## 2023-11-22 MED ORDER — ESTRADIOL 0.1 MG/GM VA CREA: 0.5000 g | TOPICAL_CREAM | VAGINAL | 3 refills | Status: DC

## 2023-11-22 NOTE — Assessment & Plan Note (Signed)
-   For symptomatic vaginal atrophy options include lubrication with a water-based lubricant, personal hygiene measures and barrier protection against wetness, and estrogen replacement in the form of vaginal cream, vaginal tablets, or a time-released vaginal ring.   - Rx to start low dose vaginal estrogen

## 2023-11-22 NOTE — Assessment & Plan Note (Addendum)
-   avoid fluid intake 3 hours before bedtime - elevated feet during the day or use compression socks to reduce lower extremity swelling - due to snoring, consider sleep study to r/o sleep apnea - encouraged weight loss due to reduced snoring in the past with weight reduction
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
## 2023-11-22 NOTE — Assessment & Plan Note (Addendum)
-   catheterized for 25mL - likely due to high tone pelvic floor - encouraged pelvic floor relaxation exercises - repeat PVR if clinical change

## 2023-11-22 NOTE — Assessment & Plan Note (Signed)
-   The origin of pelvic floor muscle spasm can be multifactorial, including primary, reactive to a different pain source, trauma, or even part of a centralized pain syndrome.Treatment options include pelvic floor physical therapy, local (vaginal) or oral  muscle relaxants, pelvic muscle trigger point injections or centrally acting pain medications.   - encouraged pelvic floor relaxation exercises due to reduced discomfort and pressure with pelvic floor relaxation on exam - start low dose vaginal estrogen for atrophy

## 2023-11-22 NOTE — Assessment & Plan Note (Addendum)
-   uses dulcolax every 2 months with intermittent straining - For constipation, we reviewed the importance of a better bowel regimen.  We also discussed the importance of avoiding chronic straining, as it can exacerbate her pelvic floor symptoms; we discussed treating constipation and straining prior to surgery, as postoperative straining can lead to damage to the repair and recurrence of symptoms. We discussed initiating therapy with increasing fluid intake, fiber supplementation, stool softeners, and laxatives such as miralax.  - discussed pelvic floor relaxation and squatting position for bowel movements - encouraged titration of fiber supplementation to optimize stool consistency to reduce Dulcolax use due to fecal leakage with loose stool - discussed association with pelvic floor disorders

## 2023-11-22 NOTE — Patient Instructions (Addendum)
 For treatment of stress urinary incontinence, which is leakage with physical activity/movement/strainging/coughing, we discussed expectant management versus nonsurgical options versus surgery. Nonsurgical options include weight loss, physical therapy, as well as a pessary.  Surgical options include a midurethral sling, which is a synthetic mesh sling that acts like a hammock under the urethra to prevent leakage of urine, a Burch urethropexy, and transurethral injection of a bulking agent.   For vaginal atrophy (thinning of the vaginal tissue that can cause dryness and burning) and UTI prevention we discussed estrogen replacement in the form of vaginal cream.   Start vaginal estrogen therapy nightly for two weeks then 2 times weekly at night. This can be placed with your finger or an applicator inside the vagina and around the urethra.  Please let us  know if the prescription is too expensive and we can look for alternative options.   Is vaginal estrogen therapy safe for me? Vaginal estrogen preparations act on the vaginal skin, and only a very tiny amount is absorbed into the bloodstream (0.01%).  They work in a similar way to hand or face cream.  There is minimal absorption and they are therefore perfectly safe. If you have had breast cancer and have persistent troublesome symptoms which aren't settling with vaginal moisturisers and lubricants, local estrogen treatment may be a possibility, but consultation with your oncologist should take place first.   Constipation: Our goal is to achieve formed bowel movements daily or every-other-day.  You may need to try different combinations of the following options to find what works best for you - everybody's body works differently so feel free to adjust the dosages as needed.  Some options to help maintain bowel health include:  Dietary changes (more leafy greens, vegetables and fruits; less processed foods) Fiber supplementation (Benefiber, FiberCon, Metamucil  or Psyllium). Start slow and increase gradually to full dose. Over-the-counter agents such as: stool softeners (Docusate or Colace) and/or laxatives (Miralax, milk of magnesia)  Power Pudding is a natural mixture that may help your constipation.  To make blend 1 cup applesauce, 1 cup wheat bran, and 3/4 cup prune juice, refrigerate and then take 1 tablespoon daily with a large glass of water as needed.   Women should try to eat at least 21 to 25 grams of fiber a day, while men should aim for 30 to 38 grams a day. You can add fiber to your diet with food or a fiber supplement such as psyllium (metamucil), benefiber, or fibercon.   Here's a look at how much dietary fiber is found in some common foods. When buying packaged foods, check the Nutrition Facts label for fiber content. It can vary among brands.  Fruits Serving size Total fiber (grams)*  Raspberries 1 cup 8.0  Pear 1 medium 5.5  Apple, with skin 1 medium 4.5  Banana 1 medium 3.0  Orange 1 medium 3.0  Strawberries 1 cup 3.0   Vegetables Serving size Total fiber (grams)*  Green peas, boiled 1 cup 9.0  Broccoli, boiled 1 cup chopped 5.0  Turnip greens, boiled 1 cup 5.0  Brussels sprouts, boiled 1 cup 4.0  Potato, with skin, baked 1 medium 4.0  Sweet corn, boiled 1 cup 3.5  Cauliflower, raw 1 cup chopped 2.0  Carrot, raw 1 medium 1.5   Grains Serving size Total fiber (grams)*  Spaghetti, whole-wheat, cooked 1 cup 6.0  Barley, pearled, cooked 1 cup 6.0  Bran flakes 3/4 cup 5.5  Quinoa, cooked 1 cup 5.0  Oat bran muffin  1 medium 5.0  Oatmeal, instant, cooked 1 cup 5.0  Popcorn, air-popped 3 cups 3.5  Brown rice, cooked 1 cup 3.5  Bread, whole-wheat 1 slice 2.0  Bread, rye 1 slice 2.0   Legumes, nuts and seeds Serving size Total fiber (grams)*  Split peas, boiled 1 cup 16.0  Lentils, boiled 1 cup 15.5  Black beans, boiled 1 cup 15.0  Baked beans, canned 1 cup 10.0  Chia seeds 1 ounce 10.0  Almonds 1 ounce (23 nuts) 3.5   Pistachios 1 ounce (49 nuts) 3.0  Sunflower kernels 1 ounce 3.0  *Rounded to nearest 0.5 gram. Source: [employer] for [employer], [employer] pelvic floor relaxation exercises at home.

## 2023-11-22 NOTE — Assessment & Plan Note (Addendum)
-   POCT UA negative  - SUI 1x/week - For treatment of stress urinary incontinence,  non-surgical options include expectant management, weight loss, physical therapy, as well as a pessary.  Surgical options include a midurethral sling, Burch urethropexy, and transurethral injection of a bulking agent. - Rx to start low dose vaginal estrogen - encouraged optimization of stool consistency and weight reduction - reviewed fluid management

## 2023-11-24 ENCOUNTER — Emergency Department (HOSPITAL_COMMUNITY)
Admission: EM | Admit: 2023-11-24 | Discharge: 2023-11-25 | Disposition: A | Discharge status: Other Acute Inpatient Hospital | Attending: Emergency Medicine | Admitting: Emergency Medicine

## 2023-11-24 ENCOUNTER — Encounter (HOSPITAL_COMMUNITY): Payer: Self-pay | Admitting: *Deleted

## 2023-11-24 ENCOUNTER — Other Ambulatory Visit: Payer: Self-pay

## 2023-11-24 DIAGNOSIS — K0889 Other specified disorders of teeth and supporting structures: Secondary | ICD-10-CM | POA: Insufficient documentation

## 2023-11-24 MED ORDER — ACETAMINOPHEN 325 MG PO TABS
ORAL_TABLET | ORAL | Status: AC
  Filled 2023-11-24: qty 2

## 2023-11-24 MED ORDER — ACETAMINOPHEN 325 MG PO TABS
650.0000 mg | ORAL_TABLET | Freq: Once | ORAL | Status: AC
  Administered 2023-11-24: 650 mg via ORAL

## 2023-11-24 NOTE — ED Notes (Signed)
 Pt name called for updated vitals, no response

## 2023-11-24 NOTE — ED Notes (Signed)
 She has not taken anything for her tooth pain

## 2023-11-24 NOTE — ED Triage Notes (Signed)
 Pt reports severe L lower tooth pain. Pt tried to go to dentist, but they closed early today and will not be back in office until Tuesday.

## 2023-11-25 DIAGNOSIS — K0889 Other specified disorders of teeth and supporting structures: Secondary | ICD-10-CM | POA: Diagnosis not present

## 2023-11-25 MED ORDER — AMOXICILLIN 500 MG PO CAPS
500.0000 mg | ORAL_CAPSULE | Freq: Once | ORAL | Status: AC
  Administered 2023-11-25: 500 mg via ORAL
  Filled 2023-11-25: qty 1

## 2023-11-25 MED ORDER — NAPROXEN 500 MG PO TABS: 500.0000 mg | ORAL_TABLET | Freq: Two times a day (BID) | ORAL | 0 refills | Status: AC

## 2023-11-25 MED ORDER — AMOXICILLIN 500 MG PO CAPS: 500.0000 mg | ORAL_CAPSULE | Freq: Three times a day (TID) | ORAL | 0 refills | Status: DC

## 2023-11-25 MED ORDER — KETOROLAC TROMETHAMINE 15 MG/ML IJ SOLN
15.0000 mg | Freq: Once | INTRAMUSCULAR | Status: AC
  Administered 2023-11-25: 15 mg via INTRAMUSCULAR
  Filled 2023-11-25: qty 1

## 2023-11-25 NOTE — ED Provider Notes (Signed)
 [facility] AT [facility] Provider Note   CSN: [id number] Arrival date & time: 11/24/23  1537     Patient presents with: Dental Pain   Andrea Villegas is a 63 y.o. female.   63 year old female presents to the emergency department for evaluation of left lower dental pain x 2 days. Tried some Naproxen yesterday without relief. Given tylenol  in triage with some improvement. No hx of dental trauma or fever. Tried to see her dentist earlier today, but the office was closing.   The history is provided by the patient. No language interpreter was used.  Dental Pain      Prior to Admission medications   Medication Sig Start Date End Date Taking? Authorizing Provider  amoxicillin (AMOXIL) 500 MG capsule Take 1 capsule (500 mg total) by mouth 3 (three) times daily. 11/25/23  Yes Keith Sor, PA-C  naproxen (NAPROSYN) 500 MG tablet Take 1 tablet (500 mg total) by mouth 2 (two) times daily. 11/25/23  Yes Keith Sor, PA-C  estradiol (ESTRACE) 0.1 MG/GM vaginal cream Place 0.5 g vaginally 2 (two) times a week. Place 0.5g nightly for two weeks then twice a week after 11/23/23   Guadlupe Lianne DASEN, MD    Allergies: Patient has no known allergies.    Review of Systems Ten systems reviewed and are negative for acute change, except as noted in the HPI.    Updated Vital Signs BP 113/80 (BP Location: Right Arm)   Pulse 72   Temp 98.6 F (37 C) (Oral)   Resp 19   Ht 4' 10 (1.473 m)   Wt 65.8 kg   SpO2 93%   BMI 30.32 kg/m   Physical Exam Vitals and nursing note reviewed.  Constitutional:      General: She is not in acute distress.    Appearance: She is well-developed. She is not diaphoretic.  HENT:     Head: Normocephalic and atraumatic.     Mouth/Throat:     Comments: TTP of the L lower 1st and 2nd molar, mild. No gingival swelling or fluctuance. No trismus. Normal phonation. Tongue midline. Tolerating secretions without issue. Eyes:     General: No  scleral icterus.    Conjunctiva/sclera: Conjunctivae normal.  Pulmonary:     Effort: Pulmonary effort is normal. No respiratory distress.     Comments: Respirations even and unlabored Musculoskeletal:        General: Normal range of motion.     Cervical back: Normal range of motion.  Skin:    General: Skin is warm and dry.     Coloration: Skin is not pale.     Findings: No erythema or rash.  Neurological:     Mental Status: She is alert and oriented to person, place, and time.  Psychiatric:        Behavior: Behavior normal.     (all labs ordered are listed, but only abnormal results are displayed) Labs Reviewed - No data to display  EKG: None  Radiology: No results found.   Procedures   Medications Ordered in the ED  ketorolac  (TORADOL ) 15 MG/ML injection 15 mg (has no administration in time range)  amoxicillin (AMOXIL) capsule 500 mg (has no administration in time range)  acetaminophen  (TYLENOL ) tablet 650 mg (650 mg Oral Given 11/24/23 1700)  Medical Decision Making Risk Prescription drug management.   This patient presents to the ED for concern of dental pain, this involves an extensive number of treatment options, and is a complaint that carries with it a high risk of complications and morbidity.  The differential diagnosis includes dental fracture vs dental abscess vs trigeminal neuralgia vs ludwig's angina   Co morbidities that complicate the patient evaluation  None known   Cardiac Monitoring:  The patient was maintained on a cardiac monitor.  I personally viewed and interpreted the cardiac monitored which showed an underlying rhythm of: NSR   Medicines ordered and prescription drug management:  I ordered medication including amoxicillin and Toradol  for pain  Reevaluation of the patient after these medicines showed that the patient stayed the same I have reviewed the patients home medicines and have made  adjustments as needed   Test Considered:  Panorex   Problem List / ED Course:  Patient with toothache.  No gross abscess.  Exam unconcerning for Ludwig's angina or spread of infection.  Will treat with amoxicillin and pain medicine.  Urged patient to follow-up with dentist.     Reevaluation:  After the interventions noted above, I reevaluated the patient and found that they have :stayed the same   Social Determinants of Health:  Insured patient   Dispostion:  After consideration of the diagnostic results and the patients response to treatment, I feel that the patent would benefit from dental f/u. Started on abx and NSAIDs pending outpatient reassessment. Return precautions discussed and provided. Patient discharged in stable condition with no unaddressed concerns.       Final diagnoses:  Dentalgia    ED Discharge Orders          Ordered    amoxicillin (AMOXIL) 500 MG capsule  3 times daily        11/25/23 0128    naproxen (NAPROSYN) 500 MG tablet  2 times daily        11/25/23 0128               Keith Sor, PA-C 11/25/23 0143    Long, Joshua G, MD 11/26/23 [phone]

## 2023-11-25 NOTE — Discharge Instructions (Addendum)
 Follow-up with a dentist.  Only a dentist can fix your problem.  We have provided the name of an alternate dentist if you no longer wish to see your current provider.  We recommend that you take amoxicillin as prescribed to treat likely underlying infection.  Take naproxen as prescribed for pain control.  Return to the ED for any new or concerning symptoms.

## 2024-02-27 ENCOUNTER — Encounter: Payer: Self-pay | Admitting: Obstetrics

## 2024-02-27 ENCOUNTER — Ambulatory Visit: Admitting: Obstetrics

## 2024-02-27 VITALS — BP 108/72 | HR 74

## 2024-02-27 DIAGNOSIS — N952 Postmenopausal atrophic vaginitis: Secondary | ICD-10-CM

## 2024-02-27 DIAGNOSIS — R351 Nocturia: Secondary | ICD-10-CM

## 2024-02-27 DIAGNOSIS — N393 Stress incontinence (female) (male): Secondary | ICD-10-CM

## 2024-02-27 MED ORDER — ESTRADIOL 0.01 % VA CREA: TOPICAL_CREAM | VAGINAL | 3 refills | Status: AC

## 2024-02-27 NOTE — Assessment & Plan Note (Addendum)
-   11/22/23 POCT UA negative  - SUI 1x/week reduced to 2x/year - For treatment of stress urinary incontinence,  non-surgical options include expectant management, weight loss, physical therapy, as well as a pessary.  Surgical options include a midurethral sling, Burch urethropexy, and transurethral injection of a bulking agent. - Rx to resume low dose vaginal estrogen - continue to optimize stool consistency and resume exercises for weight reduction - continue fluid management and diet modification with avoiding carbohydrates - reviewed office procedure with urethral bulking (Bulkamid) per pt request. We discussed success rate of approximately 70-80% and possible need for second injection. We reviewed that this is not a permanent procedure and the Bulkamid does become less effective over time. Risks reviewed including injury to bladder or urethra, UTI, urinary retention and hematuria.  - discussed need to document positive CST on exam, offered simple CMG if negative CST on exam. Patient declined since she dislikes vaginal exams, prefers to return for repeat exam with a full bladder after restarting vaginal estrogen

## 2024-02-27 NOTE — Assessment & Plan Note (Addendum)
-   improved when she avoids fluid intake 3 hours before bedtime - due to snoring, consider sleep study to r/o sleep apnea if symptoms worsen - encouraged to resume exercises for weight loss due to reduced snoring in the past with weight reduction

## 2024-02-27 NOTE — Patient Instructions (Addendum)
 For vaginal atrophy (thinning of the vaginal tissue that can cause dryness and burning) and UTI prevention we discussed estrogen replacement in the form of vaginal cream.   Start vaginal estrogen therapy nightly for two weeks then 2 times weekly at night. This can be placed with your finger or an applicator inside the vagina and around the urethra.  Please let us  know if the prescription is too expensive and we can look for alternative options.   Is vaginal estrogen therapy safe for me? Vaginal estrogen preparations act on the vaginal skin, and only a very tiny amount is absorbed into the bloodstream (0.01%).  They work in a similar way to hand or face cream.  There is minimal absorption and they are therefore perfectly safe. If you have had breast cancer and have persistent troublesome symptoms which aren't settling with vaginal moisturisers and lubricants, local estrogen treatment may be a possibility, but consultation with your oncologist should take place first.   Continue to avoid fluid intake around 3 hours before bedtime.   Continue to increase dietary fiber and monitor stool consistency.   Resume exercises and continue diet modification with reduced sugar intake for weight loss.   Please return with a full bladder for repeat cough stress test.

## 2024-02-27 NOTE — Progress Notes (Addendum)
 [facility] Return Visit  SUBJECTIVE  History of Present Illness: Andrea Villegas is a 64 y.o. female seen in follow-up for stress urinary incontinence, feeling of incomplete bladder emptying, nocturia, vaginal atrophy, constipation, and dyspareunia. Plan at last visit was low dose vaginal estrogen, pelvic floor relaxation exercises, titration of fiber supplementation.   Vaginal estrogen with relief of dryness during use for 2 weeks, did not continue after completion of tube. Denies intercourse Started tea or orange juice and pomegranate in the morning with improved bowel movements SUI 1x/week reduced to 2x/year Day time voids 2-3.  Nocturia: 2x/night to void reduced to 2x/week when she drinks a lot of water at night time Drinks 8oz Camomile tea at night for sleep at around 9-10pm Reduced sugar intake for weight loss and increased protein intake, desires to resume exercises  Pending move 03/2024 to [place]    Past Medical History: Patient  has a past medical history of Lung nodules and Medical history non-contributory.   Past Surgical History: She  has a past surgical history that includes Cholecystectomy; Tubal ligation; and Cesarean section.   Medications: She has a current medication list which includes the following prescription(s): [START ON 02/29/2024] estradiol , aleve  pm, and naproxen .   Allergies: Patient has no known allergies.   Social History: Patient  reports that she has never smoked. She has never used smokeless tobacco. She reports that she does not drink alcohol and does not use drugs.     OBJECTIVE     Physical Exam: Vitals:   02/27/24 1405  BP: 108/72  Pulse: 74   Gen: No apparent distress, A&O x 3.  Detailed Urogynecologic Evaluation:  Deferred. Prior exam showed:      No data to display             ASSESSMENT AND PLAN    Andrea Villegas is a 64 y.o. with:  1. Vaginal atrophy   2. Nocturia   3. SUI (stress urinary incontinence,  female)     Vaginal atrophy Assessment & Plan: - For symptomatic vaginal atrophy options include lubrication with a water-based lubricant, personal hygiene measures and barrier protection against wetness, and estrogen replacement in the form of vaginal cream, vaginal tablets, or a time-released vaginal ring.   - Rx to resume low dose vaginal estrogen due to relief during loading dose - refill for 1 year, pending move in 03/2024 - pt desires to return for visit prior to move  Orders: -     Estradiol ; Place 0.5-1g nightly for two weeks then twice a week after  Dispense: 30 g; Refill: 3  Nocturia Assessment & Plan: - improved when she avoids fluid intake 3 hours before bedtime - due to snoring, consider sleep study to r/o sleep apnea if symptoms worsen - encouraged to resume exercises for weight loss due to reduced snoring in the past with weight reduction    SUI (stress urinary incontinence, female) Assessment & Plan: - 11/22/23 POCT UA negative  - SUI 1x/week reduced to 2x/year - For treatment of stress urinary incontinence,  non-surgical options include expectant management, weight loss, physical therapy, as well as a pessary.  Surgical options include a midurethral sling, Burch urethropexy, and transurethral injection of a bulking agent. - Rx to resume low dose vaginal estrogen - continue to optimize stool consistency and resume exercises for weight reduction - continue fluid management and diet modification with avoiding carbohydrates - reviewed office procedure with urethral bulking (Bulkamid) per pt request. We discussed success rate of approximately  70-80% and possible need for second injection. We reviewed that this is not a permanent procedure and the Bulkamid does become less effective over time. Risks reviewed including injury to bladder or urethra, UTI, urinary retention and hematuria.  - discussed need to document positive CST on exam, offered simple CMG if negative CST on  exam. Patient declined since she dislikes vaginal exams, prefers to return for repeat exam with a full bladder after restarting vaginal estrogen    Andrea ONEIDA Gillis, MD

## 2024-02-27 NOTE — Assessment & Plan Note (Addendum)
-   For symptomatic vaginal atrophy options include lubrication with a water-based lubricant, personal hygiene measures and barrier protection against wetness, and estrogen replacement in the form of vaginal cream, vaginal tablets, or a time-released vaginal ring.   - Rx to resume low dose vaginal estrogen due to relief during loading dose - refill for 1 year, pending move in 03/2024 - pt desires to return for visit prior to move
